# Patient Record
Sex: Female | Born: 1970
Health system: Southern US, Community
[De-identification: ages and names within clinical notes are randomized; demographics above are authoritative.]

---

## 2010-06-26 ENCOUNTER — Emergency Department: Payer: Self-pay | Admitting: Emergency Medicine

## 2011-03-15 ENCOUNTER — Ambulatory Visit: Payer: Self-pay | Admitting: Family Medicine

## 2016-03-18 ENCOUNTER — Encounter: Payer: Self-pay | Admitting: Family Medicine

## 2016-03-18 ENCOUNTER — Ambulatory Visit: Payer: Self-pay

## 2016-03-18 ENCOUNTER — Ambulatory Visit (INDEPENDENT_AMBULATORY_CARE_PROVIDER_SITE_OTHER): Payer: BLUE CROSS/BLUE SHIELD | Admitting: Family Medicine

## 2016-03-18 VITALS — BP 112/78 | HR 76 | Ht 65.0 in | Wt 165.0 lb

## 2016-03-18 DIAGNOSIS — M23311 Other meniscus derangements, anterior horn of medial meniscus, right knee: Secondary | ICD-10-CM | POA: Diagnosis not present

## 2016-03-18 DIAGNOSIS — M25561 Pain in right knee: Secondary | ICD-10-CM

## 2016-03-18 MED ORDER — DICLOFENAC SODIUM 2 % TD SOLN: 2.0000 "application " | Freq: Two times a day (BID) | TRANSDERMAL | 3 refills | Status: DC

## 2016-03-18 NOTE — Progress Notes (Signed)
Tawana ScaleZach Kamarion Zagami D.O. [facility] [address] [place], [place] [address] Phone: [phone]) [phone] Subjective:    I'm seeing this patient by the request  of:  No primary care provider on file.   CC: Right knee pain  YQI:[id number]:Subjective  Jean MingleJoanna Flynn is a 45 y.o. female coming in with complaint of right knee pain. Patient states 5 months ago was hiking and felt some pain when she was going down hill. Patient states that all the pain has been on the medial aspect of the knee. Has been more intermittent. States over the course of the last several weeks has started having increasing instability to a certain degree. More pain even when sitting. States twisting motions causes severe pain. States that the knee has felt like it has buckled one time since this injury. Has never fallen. Has noticed some mild swelling that seems to be intermittent. Not waking her up at night. It is affecting some daily activities. Rates the severity of 5 out of 10.     No past medical history on file. No past surgical history on file. Social History   Social History  . Marital status: Single    Spouse name: N/A  . Number of children: N/A  . Years of education: N/A   Social History Main Topics  . Smoking status: Not on file  . Smokeless tobacco: Not on file  . Alcohol use Not on file  . Drug use: Unknown  . Sexual activity: Not on file   Other Topics Concern  . Not on file   Social History Narrative  . No narrative on file   Not on File No family history on file. No family history rheumatological diseases  Past medical history, social, surgical and family history all reviewed in electronic medical record.  No pertanent information unless stated regarding to the chief complaint.   Review of Systems:Review of systems updated and as accurate as of 03/18/16  No headache, visual changes, nausea, vomiting, diarrhea, constipation, dizziness, abdominal pain, skin rash, fevers, chills, night sweats, weight loss,  swollen lymph nodes, body aches, joint swelling, muscle aches, chest pain, shortness of breath, mood changes.   Objective  Blood pressure 112/78, pulse 76, height 5\' 5"  (1.651 m), weight 165 lb (74.8 kg), SpO2 98 %. Systems examined below as of 03/18/16   General: No apparent distress alert and oriented x3 mood and affect normal, dressed appropriately.  HEENT: Pupils equal, extraocular movements intact  Respiratory: Patient's speak in full sentences and does not appear short of breath  Cardiovascular: No lower extremity edema, non tender, no erythema  Skin: Warm dry intact with no signs of infection or rash on extremities or on axial skeleton.  Abdomen: Soft nontender  Neuro: Cranial nerves II through XII are intact, neurovascularly intact in all extremities with 2+ DTRs and 2+ pulses.  Lymph: No lymphadenopathy of posterior or anterior cervical chain or axillae bilaterally.  Gait normal with good balance and coordination.  MSK:  Non tender with full range of motion and good stability and symmetric strength and tone of shoulders, elbows, wrist, hip, and ankles bilaterally.  Knee: Right Normal to inspection with no erythema or effusion or obvious bony abnormalities. There comes vein on the right leg noted Palpation normal with no warmth, joint line tenderness, patellar tenderness, or condyle tenderness. ROM full in flexion and extension and lower leg rotation. Ligaments with solid consistent endpoints including ACL, PCL, LCL, MCL. Positive Mcmurray's, Apley's, and Thessalonian tests. Non painful patellar compression.  Patellar glide without crepitus. Patellar and quadriceps tendons unremarkable. Hamstring and quadriceps strength is normal.    MSK US performed of: Right This study was ordered, performed, and interpreted by Terrilee FilesZach Aliceson Dolbow D.O.  Knee: Moderate narrowing of the medial joint line noted. Patient does have a fairly large meniscal tear. Appears to be acute on chronic degenerative  changes. Mild displacement of 10-15 percent. Seems to involve approximately 60-65% of the meniscus.  IMPRESSION:  Medial meniscal tear with underlying arthritis    Impression and Recommendations:     This case required medical decision making of moderate complexity.      Note: This dictation was prepared with Dragon dictation along with smaller phrase technology. Any transcriptional errors that result from this process are unintentional.

## 2016-03-18 NOTE — Assessment & Plan Note (Signed)
Patient's tear does start on the anterior aspect of the knee and goes posteriorly. Does have some mild displacement. Given topical anti-inflammatories, work with Event organiserathletic trainer, we discussed icing regimen, discussed which activities to avoid. Patient has increasing instability we may need to consider possible advance imaging. Patient is concerned that she is very active and wants to continue to be active. Follow-up again in 2-3 weeks.

## 2016-03-18 NOTE — Patient Instructions (Signed)
Good to see you  Ice 20 minutes 2 times daily. Usually after activity and before bed. Exercises 3 times a week.  pennsaid pinkie amount topically 2 times daily as needed.  Avoid twisting motions Biking and elliptical would be good.  See me again in 2-3 weeks and if not better we may need an MRI.

## 2016-04-07 NOTE — Progress Notes (Signed)
Tawana ScaleZach Smith D.O. [facility] [address] [place], [place] [address] Phone: [phone] Subjective:     CC: Right knee pain f/u  GNF:[id number]:Subjective  Jean MingleJoanna Zalewski is a 45 y.o. female coming in with complaint of right knee pain.Found to have a large anterior medial meniscal tear of the meniscus. Patient was to try conservative therapy including topical anti-inflammatory's, home exercises and icing. Patient states No significant improvement. Continues to have pain. Still has some mild instability. Having difficulty overall. Still having pain even difficult he is a daily activity.   outside x-rays taken 03/17/2016 show no significant bony abnormality.  No past medical history on file. No past surgical history on file. Social History   Social History  . Marital status: Single    Spouse name: N/A  . Number of children: N/A  . Years of education: N/A   Social History Main Topics  . Smoking status: Not on file  . Smokeless tobacco: Not on file  . Alcohol use Not on file  . Drug use: Unknown  . Sexual activity: Not on file   Other Topics Concern  . Not on file   Social History Narrative  . No narrative on file   Not on File no known drug allergies No family history on file. No family history rheumatological diseases  Past medical history, social, surgical and family history all reviewed in electronic medical record.  No pertanent information unless stated regarding to the chief complaint.   Review of Systems:Review of systems updated and as accurate as of 04/08/16  No headache, visual changes, nausea, vomiting, diarrhea, constipation, dizziness, abdominal pain, skin rash, fevers, chills, night sweats, weight loss, swollen lymph nodes, body aches, joint swelling, muscle aches, chest pain, shortness of breath, mood changes.   Objective  Blood pressure 126/80, pulse 62, height 5\' 5"  (1.651 m), weight 167 lb (75.8 kg), SpO2 98 %.  Systems examined below as of  04/08/16 General: NAD A&O x3 mood, affect normal  HEENT: Pupils equal, extraocular movements intact no nystagmus Respiratory: not short of breath at rest or with speaking Cardiovascular: No lower extremity edema, non tender Skin: Warm dry intact with no signs of infection or rash on extremities or on axial skeleton. Abdomen: Soft nontender, no masses Neuro: Cranial nerves  intact, neurovascularly intact in all extremities with 2+ DTRs and 2+ pulses. Lymph: No lymphadenopathy appreciated today  Gait normal with good balance and coordination.  MSK: Non tender with full range of motion and good stability and symmetric strength and tone of shoulders, elbows, wrist,  hips and ankles bilaterally.   Knee: Right Normal to inspection with no erythema or effusion or obvious bony abnormalities. There comes vein on the right leg noted no change Tenderness to palpation over the medial joint line possibly worse than previous exam ROM full in flexion and extension and lower leg rotation. Ligaments with solid consistent endpoints including ACL, PCL, LCL, MCL. Positive Mcmurray's, Apley's, and Thessalonian tests. Mild painful patellar compression. Patellar glide without crepitus. Patellar and quadriceps tendons unremarkable. Hamstring and quadriceps strength is normal.  Contralateral knee unremarkable  MSK US performed of: Right This study was ordered, performed, and interpreted by Terrilee FilesZach Smith D.O.  Knee: Moderate narrowing of the medial joint line noted. Patient does have a fairly large meniscal tear. Appears to be acute on chronic degenerative changes. Mild displacement of 10 percent still remaining. Significant decrease in hypoechoic changes. Seems to have some healing interval since last exam  IMPRESSION:  Medial meniscal  tear with underlying arthritis noted with some interval healing  After informed written and verbal consent, patient was seated on exam table. Right knee was prepped with alcohol  swab and utilizing anterolateral approach, patient's right knee space was injected with 4:1  marcaine 0.5%: Kenalog 40mg /dL. Patient tolerated the procedure well without immediate complications.    Impression and Recommendations:     This case required medical decision making of moderate complexity.      Note: This dictation was prepared with Dragon dictation along with smaller phrase technology. Any transcriptional errors that result from this process are unintentional.

## 2016-04-08 ENCOUNTER — Ambulatory Visit (INDEPENDENT_AMBULATORY_CARE_PROVIDER_SITE_OTHER): Payer: BLUE CROSS/BLUE SHIELD | Admitting: Family Medicine

## 2016-04-08 ENCOUNTER — Ambulatory Visit: Payer: Self-pay

## 2016-04-08 VITALS — BP 126/80 | HR 62 | Ht 65.0 in | Wt 167.0 lb

## 2016-04-08 DIAGNOSIS — M23311 Other meniscus derangements, anterior horn of medial meniscus, right knee: Secondary | ICD-10-CM

## 2016-04-08 DIAGNOSIS — M25561 Pain in right knee: Secondary | ICD-10-CM

## 2016-04-08 DIAGNOSIS — M1711 Unilateral primary osteoarthritis, right knee: Secondary | ICD-10-CM | POA: Diagnosis not present

## 2016-04-08 NOTE — Patient Instructions (Addendum)
Good to see you  Jean Flynn is your friend.  Injected the knee today  Lets get you moving. Do the exercises 3 times a week at least  No twisting  Send message in 2 weeks and if not better we will get MRI.

## 2016-04-08 NOTE — Assessment & Plan Note (Signed)
No improvement at this time. Patient still does not want have any surgical intervention so on advance imaging. It is starting formal physical therapy and is given an injection today. Tolerated the procedure well. We discussed icing regimen. If any locking or giving out on her advance imaging would be necessary. Patient will follow-up again in 4 weeks.

## 2016-06-15 ENCOUNTER — Other Ambulatory Visit: Payer: BLUE CROSS/BLUE SHIELD

## 2016-06-15 ENCOUNTER — Ambulatory Visit (INDEPENDENT_AMBULATORY_CARE_PROVIDER_SITE_OTHER)
Admission: RE | Admit: 2016-06-15 | Discharge: 2016-06-15 | Disposition: A | Discharge status: Home / Self Care | Payer: BLUE CROSS/BLUE SHIELD | Source: Ambulatory Visit | Attending: Family Medicine | Admitting: Family Medicine

## 2016-06-15 ENCOUNTER — Ambulatory Visit: Payer: Self-pay

## 2016-06-15 ENCOUNTER — Ambulatory Visit (INDEPENDENT_AMBULATORY_CARE_PROVIDER_SITE_OTHER): Payer: BLUE CROSS/BLUE SHIELD | Admitting: Family Medicine

## 2016-06-15 ENCOUNTER — Encounter: Payer: Self-pay | Admitting: Family Medicine

## 2016-06-15 VITALS — BP 132/82 | HR 70 | Ht 65.0 in | Wt 170.0 lb

## 2016-06-15 DIAGNOSIS — M25561 Pain in right knee: Secondary | ICD-10-CM

## 2016-06-15 DIAGNOSIS — M23311 Other meniscus derangements, anterior horn of medial meniscus, right knee: Secondary | ICD-10-CM

## 2016-06-15 DIAGNOSIS — M25461 Effusion, right knee: Secondary | ICD-10-CM | POA: Insufficient documentation

## 2016-06-15 DIAGNOSIS — M1711 Unilateral primary osteoarthritis, right knee: Secondary | ICD-10-CM | POA: Diagnosis not present

## 2016-06-15 NOTE — Progress Notes (Signed)
[name] [address] [place], [place] [address] Phone: [phone] Subjective:     CC: Right knee pain f/u  BJY:[id number]  Jean Flynn is a 46 y.o. female coming in with complaint of right knee pain.Found to have a large anterior medial meniscal tear of the meniscus. Patient back in November was given an injection. nearly 2 months since this time. Patient was to continue conservative therapy. Patient states Having given different pain at this time. States that there is increase in swelling. Pain seems to be in the posterior aspect and the lateral aspect the knee. Completely different than the meniscus injury. Patient states that there is some mild instability. Patient states that this isn't going on for 5-7 days.   outside x-rays taken 03/17/2016 show no significant bony abnormality.  No past medical history on file. No past surgical history on file. Social History   Social History  . Marital status: Single    Spouse name: N/A  . Number of children: N/A  . Years of education: N/A   Social History Main Topics  . Smoking status: Never Smoker  . Smokeless tobacco: Never Used  . Alcohol use None  . Drug use: Unknown  . Sexual activity: Not Asked   Other Topics Concern  . None   Social History Narrative  . None   Not on File no known drug allergies No family history on file. No family history rheumatological diseases  Past medical history, social, surgical and family history all reviewed in electronic medical record.  No pertanent information unless stated regarding to the chief complaint.   Review of Systems: No headache, visual changes, nausea, vomiting, diarrhea, constipation, dizziness, abdominal pain, skin rash, fevers, chills, night sweats, weight loss, swollen lymph nodes,chest pain, shortness of breath, mood changes.  Positive for joint swelling of the right knee and muscle aches  Objective  Blood pressure 132/82, pulse 70, height  5\' 5"  (1.651 m), weight 170 lb (77.1 kg), last menstrual period 06/08/2016, SpO2 98 %.  Systems examined below as of 06/15/16 General: NAD A&O x3 mood, affect normal  HEENT: Pupils equal, extraocular movements intact no nystagmus Respiratory: not short of breath at rest or with speaking Cardiovascular: No lower extremity edema, non tender Skin: Warm dry intact with no signs of infection or rash on extremities or on axial skeleton. Abdomen: Soft nontender, no masses Neuro: Cranial nerves  intact, neurovascularly intact in all extremities with 2+ DTRs and 2+ pulses. Lymph: No lymphadenopathy appreciated today  Gait antalgic gait MSK: Non tender with full range of motion and good stability and symmetric strength and tone of shoulders, elbows, wrist, hips and ankles bilaterally.    Knee: Right Large effusion noted Diffusely tender Lacks last 10 of flexion secondary to the effusion Mild instability noted Positive Mcmurray's, Apley's, and Thessalonian tests. Non painful patellar compression. Patellar glide without crepitus. Patellar and quadriceps tendons unremarkable. Hamstring and quadriceps strength is normal.    MSK US performed of: Right knee This study was ordered, performed, and interpreted by Terrilee Files D.O.  Knee: Large joint effusion noted. Narrowing of the medial and the lateral patellofemoral joint. Significant synovitis noted otherwise patient does have mild to moderate narrowing of the medial and lateral joint lines. Degenerative tear noted of the medial meniscus still relevant  IMPRESSION:  Large joint effusion  Procedure: Real-time Ultrasound Guided Injection of right knee Device: GE Logiq Q7 Ultrasound guided injection is preferred based studies that show increased duration, increased effect,  greater accuracy, decreased procedural pain, increased response rate, and decreased cost with ultrasound guided versus blind injection.  Verbal informed consent obtained.    Time-out conducted.  Noted no overlying erythema, induration, or other signs of local infection.  Skin prepped in a sterile fashion.  Local anesthesia: Topical Ethyl chloride.  With sterile technique and under real time ultrasound guidance: With a 22-gauge 2 inch needle patient was injected with 4 cc of 0.5% Marcaine and aspirated and 37 mL of strawlike fluid then injected 1 cc of Kenalog 40 mg/dL. This was from a superior lateral approach.  Completed without difficulty  Pain immediately resolved suggesting accurate placement of the medication.  Advised to call if fevers/chills, erythema, induration, drainage, or persistent bleeding.  Images permanently stored and available for review in the ultrasound unit.  Impression: Technically successful ultrasound guided injection.    Impression and Recommendations:     This case required medical decision making of moderate complexity.      Note: This dictation was prepared with Dragon dictation along with smaller phrase technology. Any transcriptional errors that result from this process are unintentional.

## 2016-06-15 NOTE — Assessment & Plan Note (Signed)
Patient likely has some degenerative changes of the knee. Repeat x-rays taken today to further evaluate. We did do a significant aspiration of the knee and we'll send down to rule out anything such as gout stay could be contribute in. We discussed icing regimen and compression. Patient has had difficulty with the anterior horn of the medial meniscus previously. Any worsening symptoms or increasing instability advance imaging would be warranted with her failing all other conservative therapy.

## 2016-06-15 NOTE — Patient Instructions (Addendum)
Good to see you  Jean Flynn is your friend.  We drained the knee today  Xray downstairs today  If not better on Monday call and we will get MRI.

## 2016-06-16 LAB — SYNOVIAL CELL COUNT + DIFF, W/ CRYSTALS

## 2016-06-18 ENCOUNTER — Telehealth: Payer: Self-pay | Admitting: Family Medicine

## 2016-06-18 NOTE — Telephone Encounter (Signed)
The patient is wanting the results from her MRI and her labs

## 2016-06-21 NOTE — Telephone Encounter (Signed)
Called pt back, says # is no longer in service.

## 2016-06-21 NOTE — Telephone Encounter (Signed)
Labs normal  Xrays show moderate arthritis.

## 2016-07-17 DIAGNOSIS — S199XXA Unspecified injury of neck, initial encounter: Secondary | ICD-10-CM | POA: Diagnosis not present

## 2016-07-17 DIAGNOSIS — S8011XA Contusion of right lower leg, initial encounter: Secondary | ICD-10-CM | POA: Diagnosis not present

## 2016-07-17 DIAGNOSIS — S99911A Unspecified injury of right ankle, initial encounter: Secondary | ICD-10-CM | POA: Diagnosis not present

## 2016-07-17 DIAGNOSIS — S8991XA Unspecified injury of right lower leg, initial encounter: Secondary | ICD-10-CM | POA: Diagnosis not present

## 2016-07-17 DIAGNOSIS — M542 Cervicalgia: Secondary | ICD-10-CM | POA: Diagnosis not present

## 2016-07-17 DIAGNOSIS — Y92410 Unspecified street and highway as the place of occurrence of the external cause: Secondary | ICD-10-CM | POA: Diagnosis not present

## 2016-07-17 DIAGNOSIS — S79921A Unspecified injury of right thigh, initial encounter: Secondary | ICD-10-CM | POA: Diagnosis not present

## 2016-07-17 DIAGNOSIS — S8012XA Contusion of left lower leg, initial encounter: Secondary | ICD-10-CM | POA: Diagnosis not present

## 2016-07-17 DIAGNOSIS — Z041 Encounter for examination and observation following transport accident: Secondary | ICD-10-CM | POA: Diagnosis not present

## 2016-07-17 DIAGNOSIS — S8992XA Unspecified injury of left lower leg, initial encounter: Secondary | ICD-10-CM | POA: Diagnosis not present

## 2016-07-17 DIAGNOSIS — S99912A Unspecified injury of left ankle, initial encounter: Secondary | ICD-10-CM | POA: Diagnosis not present

## 2016-07-22 DIAGNOSIS — M79606 Pain in leg, unspecified: Secondary | ICD-10-CM | POA: Diagnosis not present

## 2016-07-22 DIAGNOSIS — M542 Cervicalgia: Secondary | ICD-10-CM | POA: Diagnosis not present

## 2016-09-17 ENCOUNTER — Ambulatory Visit (INDEPENDENT_AMBULATORY_CARE_PROVIDER_SITE_OTHER)
Admission: RE | Admit: 2016-09-17 | Discharge: 2016-09-17 | Disposition: A | Discharge status: Home / Self Care | Payer: BLUE CROSS/BLUE SHIELD | Source: Ambulatory Visit | Attending: Family Medicine | Admitting: Family Medicine

## 2016-09-17 ENCOUNTER — Ambulatory Visit (INDEPENDENT_AMBULATORY_CARE_PROVIDER_SITE_OTHER): Payer: BLUE CROSS/BLUE SHIELD | Admitting: Family Medicine

## 2016-09-17 ENCOUNTER — Encounter: Payer: Self-pay | Admitting: Family Medicine

## 2016-09-17 VITALS — BP 110/80 | HR 68 | Ht 65.0 in | Wt 170.0 lb

## 2016-09-17 DIAGNOSIS — M25561 Pain in right knee: Secondary | ICD-10-CM

## 2016-09-17 DIAGNOSIS — S82409A Unspecified fracture of shaft of unspecified fibula, initial encounter for closed fracture: Secondary | ICD-10-CM | POA: Insufficient documentation

## 2016-09-17 DIAGNOSIS — S82831A Other fracture of upper and lower end of right fibula, initial encounter for closed fracture: Secondary | ICD-10-CM | POA: Diagnosis not present

## 2016-09-17 DIAGNOSIS — S8991XA Unspecified injury of right lower leg, initial encounter: Secondary | ICD-10-CM | POA: Diagnosis not present

## 2016-09-17 MED ORDER — VITAMIN D (ERGOCALCIFEROL) 1.25 MG (50000 UNIT) PO CAPS: 50000.0000 [IU] | ORAL_CAPSULE | ORAL | 0 refills | Status: AC

## 2016-09-17 MED ORDER — PREDNISONE 50 MG PO TABS: 50.0000 mg | ORAL_TABLET | Freq: Every day | ORAL | 0 refills | Status: DC

## 2016-09-17 MED ORDER — GABAPENTIN 100 MG PO CAPS: 200.0000 mg | ORAL_CAPSULE | Freq: Every day | ORAL | 3 refills | Status: DC

## 2016-09-17 NOTE — Patient Instructions (Signed)
Good to see you  Xray downstairs You had a proximal fibular head with nerve irritation.  Prednisone daily for 5 days Gabapentin 200mg at night  Once weekly vitamin D for 12 weeks.  Ok to bike  See me again in 2 weeks.

## 2016-09-17 NOTE — Assessment & Plan Note (Signed)
I believe the patient did have more of a fibula fracture. Seems to be healing. Does cause irritation of the nerve. Patient will be in a brace, once weekly vitamin D, gabapentin for the nerve irritation. Discussed 14 certain activities. Patient still has symptoms from the meniscal tear and we'll continue to monitor. Follow-up again in 2-3 weeks.
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
## 2016-10-06 ENCOUNTER — Encounter: Payer: Self-pay | Admitting: Family Medicine

## 2016-10-06 ENCOUNTER — Ambulatory Visit: Payer: Self-pay

## 2016-10-06 ENCOUNTER — Ambulatory Visit (INDEPENDENT_AMBULATORY_CARE_PROVIDER_SITE_OTHER): Payer: BLUE CROSS/BLUE SHIELD | Admitting: Family Medicine

## 2016-10-06 ENCOUNTER — Telehealth: Payer: Self-pay

## 2016-10-06 VITALS — BP 124/84 | HR 61 | Ht 65.0 in | Wt 156.0 lb

## 2016-10-06 DIAGNOSIS — M79604 Pain in right leg: Secondary | ICD-10-CM

## 2016-10-06 DIAGNOSIS — M7121 Synovial cyst of popliteal space [Baker], right knee: Secondary | ICD-10-CM | POA: Diagnosis not present

## 2016-10-06 DIAGNOSIS — S82831D Other fracture of upper and lower end of right fibula, subsequent encounter for closed fracture with routine healing: Secondary | ICD-10-CM | POA: Diagnosis not present

## 2016-10-06 NOTE — Telephone Encounter (Signed)
Patient was walking out of appointment and inquired if she should continue with gabapentin. Dr. Katrinka BlazingSmith provided verbal confirmation that she could continue gabapentin if it was helping her sleep and that she did not need to taper once she did decide to discontinue medication. Called patient to relay information. Was unable to leave a message as voicemail box was not set up.

## 2016-10-06 NOTE — Assessment & Plan Note (Signed)
Seems well healed at this time. No change in management. Likely another 2-4 weeks to completely healed

## 2016-10-06 NOTE — Patient Instructions (Addendum)
Great to see you  Ice is still good  Continue the once weekly vitamin D Continue the gabapentin Start increasing your activity  See me again in 4 weeks. Should be good.

## 2016-10-06 NOTE — Assessment & Plan Note (Signed)
Patient did have aspiration done today. Tolerated the procedure well. We discussed icing regimen and home exercises. We discussed which x-rays to do in which ones to avoid. Patient well continue the once weekly vitamin D as well as the gabapentin at night. Follow-up again in 3-4 weeks.

## 2016-10-07 NOTE — Telephone Encounter (Signed)
Attempted to contact patient again. No answer and voicemail box is not set up.

## 2016-11-24 ENCOUNTER — Encounter: Payer: Self-pay | Admitting: Family Medicine

## 2016-11-24 ENCOUNTER — Ambulatory Visit (INDEPENDENT_AMBULATORY_CARE_PROVIDER_SITE_OTHER): Payer: BLUE CROSS/BLUE SHIELD | Admitting: Family Medicine

## 2016-11-24 VITALS — BP 120/84 | HR 86 | Resp 16 | Wt 159.0 lb

## 2016-11-24 DIAGNOSIS — S82831D Other fracture of upper and lower end of right fibula, subsequent encounter for closed fracture with routine healing: Secondary | ICD-10-CM | POA: Diagnosis not present

## 2016-11-24 DIAGNOSIS — S82141A Displaced bicondylar fracture of right tibia, initial encounter for closed fracture: Secondary | ICD-10-CM | POA: Diagnosis not present

## 2016-11-24 DIAGNOSIS — M7121 Synovial cyst of popliteal space [Baker], right knee: Secondary | ICD-10-CM | POA: Diagnosis not present

## 2016-11-24 DIAGNOSIS — M1711 Unilateral primary osteoarthritis, right knee: Secondary | ICD-10-CM

## 2016-11-24 DIAGNOSIS — M25461 Effusion, right knee: Secondary | ICD-10-CM | POA: Diagnosis not present

## 2016-11-24 DIAGNOSIS — M23311 Other meniscus derangements, anterior horn of medial meniscus, right knee: Secondary | ICD-10-CM

## 2016-11-24 NOTE — Patient Instructions (Signed)
Good to see you  We will get MRI of the right knee soon.  They will call you to schedule If swelling worsens then take duexis up to 3 times a day but watch your stomach  See me again in 4 weeks if we need to drain the knee before your trip

## 2016-11-24 NOTE — Assessment & Plan Note (Signed)
Patient is having worsening symptoms again. I do believe that this could be secondary to the injury. Occult fracture is within the differential with the tibial plateau. I do feel that advance imaging is warranted. Patient is now having antalgic gait and is affecting daily activities as well as her job performance. Patient will be set up for an MRI and depending on findings we'll discuss further medical management. Differential includes the meniscal injury, as well as the occult fracture of the fibula that was noted previously. Patient's family history is significant for bone cancer as well.
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
## 2016-11-25 ENCOUNTER — Telehealth: Payer: Self-pay

## 2016-11-25 NOTE — Telephone Encounter (Signed)
Patient called my direct number and left a voicemail wanting to know if the MRI had been approved and what ortho Dr. Katrinka BlazingSmith will be referring her to. Tried calling patient's cell phone. No answer and no ability to leave a voicemail as mail box was not set up. Want to let patient know that Dr. Katrinka BlazingSmith will use Dr. Yisroel Rammingaldorf or Luiz BlareGraves [facility] and that we are working to pre-cert the MRI and then she should hear from [facility] by this week or early next week.

## 2016-12-03 DIAGNOSIS — Z1231 Encounter for screening mammogram for malignant neoplasm of breast: Secondary | ICD-10-CM | POA: Diagnosis not present

## 2016-12-08 ENCOUNTER — Encounter: Payer: Self-pay | Admitting: Family Medicine

## 2016-12-15 DIAGNOSIS — M25461 Effusion, right knee: Secondary | ICD-10-CM | POA: Diagnosis not present
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
## 2016-12-31 ENCOUNTER — Ambulatory Visit (INDEPENDENT_AMBULATORY_CARE_PROVIDER_SITE_OTHER): Payer: BLUE CROSS/BLUE SHIELD | Admitting: Family Medicine

## 2016-12-31 ENCOUNTER — Encounter: Payer: Self-pay | Admitting: Family Medicine

## 2016-12-31 ENCOUNTER — Ambulatory Visit: Payer: Self-pay

## 2016-12-31 VITALS — BP 122/82 | HR 59 | Ht 65.0 in

## 2016-12-31 DIAGNOSIS — M7121 Synovial cyst of popliteal space [Baker], right knee: Secondary | ICD-10-CM | POA: Diagnosis not present

## 2016-12-31 MED ORDER — MELOXICAM 15 MG PO TABS
15.0000 mg | ORAL_TABLET | Freq: Every day | ORAL | 0 refills | Status: DC
Start: 2016-12-31 — End: 2018-02-07

## 2016-12-31 MED ORDER — GABAPENTIN 100 MG PO CAPS: 200.0000 mg | ORAL_CAPSULE | Freq: Every day | ORAL | 3 refills | Status: DC

## 2016-12-31 MED ORDER — TRAMADOL HCL 50 MG PO TABS: 50.0000 mg | ORAL_TABLET | Freq: Three times a day (TID) | ORAL | 0 refills | Status: DC | PRN

## 2016-12-31 NOTE — Patient Instructions (Addendum)
Have a great tri p  Drained the knee  Wear compression  Ice is your friend Tramadol up to 2 times a day if you really need it.  meloxicam daily for 10 days then as needed 2 babay aspirin daily bjut watch for bruising.  Gabapentin at night if needed See me again after your MRI.
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
## 2017-02-07 ENCOUNTER — Ambulatory Visit
Admission: RE | Admit: 2017-02-07 | Discharge: 2017-02-07 | Disposition: A | Discharge status: Home / Self Care | Payer: BLUE CROSS/BLUE SHIELD | Source: Ambulatory Visit | Attending: Family Medicine | Admitting: Family Medicine

## 2017-02-07 DIAGNOSIS — S83231A Complex tear of medial meniscus, current injury, right knee, initial encounter: Secondary | ICD-10-CM | POA: Diagnosis not present

## 2017-02-07 DIAGNOSIS — S82141A Displaced bicondylar fracture of right tibia, initial encounter for closed fracture: Secondary | ICD-10-CM
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
## 2017-02-08 ENCOUNTER — Ambulatory Visit (INDEPENDENT_AMBULATORY_CARE_PROVIDER_SITE_OTHER): Payer: BLUE CROSS/BLUE SHIELD | Admitting: Family Medicine

## 2017-02-08 ENCOUNTER — Encounter: Payer: Self-pay | Admitting: Family Medicine

## 2017-02-08 VITALS — BP 130/80 | HR 50 | Ht 65.0 in | Wt 160.0 lb

## 2017-02-08 DIAGNOSIS — M23311 Other meniscus derangements, anterior horn of medial meniscus, right knee: Secondary | ICD-10-CM | POA: Diagnosis not present

## 2017-02-08 DIAGNOSIS — M1711 Unilateral primary osteoarthritis, right knee: Secondary | ICD-10-CM | POA: Diagnosis not present

## 2017-02-08 NOTE — Patient Instructions (Signed)
good to see you  Graves for the meniscus.  We will get it set up  Do not lace last eye on the boots.  We have other injection in the long run to give you more time with the knee for sure.  See me when you need me and call if you have questions.

## 2017-02-08 NOTE — Assessment & Plan Note (Signed)
Spent  25 minutes with patient face-to-face and had greater than 50% of counseling including as described in assessment and plan. We discussed with patient about conservative therapy including viscous supplementation. Patient does have a complex tear noted that was seen initially in the ultrasound and confirmed with the MRI. Patient has not done well with conservative therapy at this point. Would like to consider surgical intervention. We discussed the possibility of this patient will be referred today. Patient does have moderate to severe arthritic changes of the medial compartment of the knee as well. Patient will need to discuss this with her surgeon to see what would be the best possible treatment options and if patient needs a unilateral compartment replacement. Patient will follow-up with me again as needed.

## 2017-02-15 DIAGNOSIS — M25561 Pain in right knee: Secondary | ICD-10-CM | POA: Diagnosis not present

## 2017-03-01 DIAGNOSIS — M25561 Pain in right knee: Secondary | ICD-10-CM | POA: Diagnosis not present

## 2017-03-01 DIAGNOSIS — M1711 Unilateral primary osteoarthritis, right knee: Secondary | ICD-10-CM | POA: Diagnosis not present

## 2017-03-08 DIAGNOSIS — M1711 Unilateral primary osteoarthritis, right knee: Secondary | ICD-10-CM | POA: Diagnosis not present

## 2017-03-15 DIAGNOSIS — M1711 Unilateral primary osteoarthritis, right knee: Secondary | ICD-10-CM | POA: Diagnosis not present

## 2017-04-12 DIAGNOSIS — M25561 Pain in right knee: Secondary | ICD-10-CM | POA: Diagnosis not present

## 2017-05-24 DIAGNOSIS — M1711 Unilateral primary osteoarthritis, right knee: Secondary | ICD-10-CM | POA: Diagnosis not present

## 2017-05-26 DIAGNOSIS — E039 Hypothyroidism, unspecified: Secondary | ICD-10-CM | POA: Diagnosis not present

## 2017-05-26 DIAGNOSIS — M25561 Pain in right knee: Secondary | ICD-10-CM | POA: Diagnosis not present

## 2017-05-26 DIAGNOSIS — Z0001 Encounter for general adult medical examination with abnormal findings: Secondary | ICD-10-CM | POA: Diagnosis not present

## 2017-05-26 DIAGNOSIS — Z124 Encounter for screening for malignant neoplasm of cervix: Secondary | ICD-10-CM | POA: Diagnosis not present

## 2017-06-24 DIAGNOSIS — M23221 Derangement of posterior horn of medial meniscus due to old tear or injury, right knee: Secondary | ICD-10-CM | POA: Diagnosis not present

## 2017-06-24 DIAGNOSIS — M6751 Plica syndrome, right knee: Secondary | ICD-10-CM | POA: Diagnosis not present

## 2017-06-24 DIAGNOSIS — M7121 Synovial cyst of popliteal space [Baker], right knee: Secondary | ICD-10-CM | POA: Diagnosis not present

## 2017-06-24 DIAGNOSIS — M94261 Chondromalacia, right knee: Secondary | ICD-10-CM | POA: Diagnosis not present

## 2017-06-24 DIAGNOSIS — M2241 Chondromalacia patellae, right knee: Secondary | ICD-10-CM | POA: Diagnosis not present

## 2017-06-24 DIAGNOSIS — M23241 Derangement of anterior horn of lateral meniscus due to old tear or injury, right knee: Secondary | ICD-10-CM | POA: Diagnosis not present

## 2017-06-24 DIAGNOSIS — G8918 Other acute postprocedural pain: Secondary | ICD-10-CM | POA: Diagnosis not present

## 2017-06-27 DIAGNOSIS — M25461 Effusion, right knee: Secondary | ICD-10-CM | POA: Diagnosis not present

## 2017-06-30 DIAGNOSIS — M25661 Stiffness of right knee, not elsewhere classified: Secondary | ICD-10-CM | POA: Diagnosis not present

## 2017-06-30 DIAGNOSIS — M25561 Pain in right knee: Secondary | ICD-10-CM | POA: Diagnosis not present

## 2017-06-30 DIAGNOSIS — M23231 Derangement of other medial meniscus due to old tear or injury, right knee: Secondary | ICD-10-CM | POA: Diagnosis not present

## 2017-07-05 DIAGNOSIS — M23231 Derangement of other medial meniscus due to old tear or injury, right knee: Secondary | ICD-10-CM | POA: Diagnosis not present

## 2017-07-05 DIAGNOSIS — M25661 Stiffness of right knee, not elsewhere classified: Secondary | ICD-10-CM | POA: Diagnosis not present

## 2017-07-05 DIAGNOSIS — M25561 Pain in right knee: Secondary | ICD-10-CM | POA: Diagnosis not present

## 2017-07-07 DIAGNOSIS — M23231 Derangement of other medial meniscus due to old tear or injury, right knee: Secondary | ICD-10-CM | POA: Diagnosis not present

## 2017-07-07 DIAGNOSIS — M25661 Stiffness of right knee, not elsewhere classified: Secondary | ICD-10-CM | POA: Diagnosis not present

## 2017-07-07 DIAGNOSIS — M25561 Pain in right knee: Secondary | ICD-10-CM | POA: Diagnosis not present

## 2017-07-11 DIAGNOSIS — M23231 Derangement of other medial meniscus due to old tear or injury, right knee: Secondary | ICD-10-CM | POA: Diagnosis not present

## 2017-07-11 DIAGNOSIS — M25561 Pain in right knee: Secondary | ICD-10-CM | POA: Diagnosis not present

## 2017-07-11 DIAGNOSIS — M25661 Stiffness of right knee, not elsewhere classified: Secondary | ICD-10-CM | POA: Diagnosis not present

## 2017-07-25 DIAGNOSIS — M25561 Pain in right knee: Secondary | ICD-10-CM | POA: Diagnosis not present

## 2017-07-25 DIAGNOSIS — M25461 Effusion, right knee: Secondary | ICD-10-CM | POA: Diagnosis not present

## 2017-07-28 DIAGNOSIS — M23231 Derangement of other medial meniscus due to old tear or injury, right knee: Secondary | ICD-10-CM | POA: Diagnosis not present

## 2017-07-28 DIAGNOSIS — M25561 Pain in right knee: Secondary | ICD-10-CM | POA: Diagnosis not present

## 2017-07-28 DIAGNOSIS — M25661 Stiffness of right knee, not elsewhere classified: Secondary | ICD-10-CM | POA: Diagnosis not present

## 2017-08-09 DIAGNOSIS — M23231 Derangement of other medial meniscus due to old tear or injury, right knee: Secondary | ICD-10-CM | POA: Diagnosis not present

## 2017-08-09 DIAGNOSIS — M1711 Unilateral primary osteoarthritis, right knee: Secondary | ICD-10-CM | POA: Diagnosis not present

## 2017-08-09 DIAGNOSIS — M25661 Stiffness of right knee, not elsewhere classified: Secondary | ICD-10-CM | POA: Diagnosis not present

## 2017-08-09 DIAGNOSIS — M25561 Pain in right knee: Secondary | ICD-10-CM | POA: Diagnosis not present

## 2017-08-15 DIAGNOSIS — M25561 Pain in right knee: Secondary | ICD-10-CM | POA: Diagnosis not present

## 2017-08-15 DIAGNOSIS — M23231 Derangement of other medial meniscus due to old tear or injury, right knee: Secondary | ICD-10-CM | POA: Diagnosis not present

## 2017-08-15 DIAGNOSIS — M25661 Stiffness of right knee, not elsewhere classified: Secondary | ICD-10-CM | POA: Diagnosis not present

## 2017-09-16 DIAGNOSIS — L821 Other seborrheic keratosis: Secondary | ICD-10-CM | POA: Diagnosis not present

## 2017-09-16 DIAGNOSIS — L578 Other skin changes due to chronic exposure to nonionizing radiation: Secondary | ICD-10-CM | POA: Diagnosis not present

## 2017-09-16 DIAGNOSIS — L300 Nummular dermatitis: Secondary | ICD-10-CM | POA: Diagnosis not present

## 2017-09-16 DIAGNOSIS — D225 Melanocytic nevi of trunk: Secondary | ICD-10-CM | POA: Diagnosis not present

## 2017-09-16 DIAGNOSIS — L570 Actinic keratosis: Secondary | ICD-10-CM | POA: Diagnosis not present

## 2017-10-04 DIAGNOSIS — M23311 Other meniscus derangements, anterior horn of medial meniscus, right knee: Secondary | ICD-10-CM | POA: Diagnosis not present

## 2017-12-12 NOTE — Progress Notes (Signed)
Jean ScaleZach Flynn D.O. [facility] [address] [place], [place] [address] Phone: [phone] Subjective:    I'm seeing this patient by the request  of:    CC: Right knee pain  BJY:[id number]:Subjective  Jean MingleJoanna Flynn is a 47 y.o. female coming in with complaint of right knee pain. Had surgery in February. States her knee is swollen. Knee locks up. Has to use the opposite leg to help extend it. Knee has to pop before she can use it. Still wears the brace prescribed. No numbness and tingling. Shooting pain down the leg.   Onset- Chronic Location- Lateral, posterior Duration-  Character-dull, throbbing aching Aggravating factors- Flexion, extension, sitting to standing Reliving factors- Ice Therapies tried-  Severity-8 out of 10.  Patient is considering the possibility of replacement     History reviewed. No pertinent past medical history. History reviewed. No pertinent surgical history. Social History   Socioeconomic History  . Marital status: Single    Spouse name: Not on file  . Number of children: Not on file  . Years of education: Not on file  . Highest education level: Not on file  Occupational History  . Not on file  Social Needs  . Financial resource strain: Not on file  . Food insecurity:    Worry: Not on file    Inability: Not on file  . Transportation needs:    Medical: Not on file    Non-medical: Not on file  Tobacco Use  . Smoking status: Never Smoker  . Smokeless tobacco: Never Used  Substance and Sexual Activity  . Alcohol use: Not on file  . Drug use: Not on file  . Sexual activity: Not on file  Lifestyle  . Physical activity:    Days per week: Not on file    Minutes per session: Not on file  . Stress: Not on file  Relationships  . Social connections:    Talks on phone: Not on file    Gets together: Not on file    Attends religious service: Not on file    Active member of club or organization: Not on file    Attends meetings of clubs or  organizations: Not on file    Relationship status: Not on file  Other Topics Concern  . Not on file  Social History Narrative  . Not on file   Not on File History reviewed. No pertinent family history.   Past medical history, social, surgical and family history all reviewed in electronic medical record.  No pertanent information unless stated regarding to the chief complaint.   Review of Systems:Review of systems updated and as accurate as of 12/14/17  No headache, visual changes, nausea, vomiting, diarrhea, constipation, dizziness, abdominal pain, skin rash, fevers, chills, night sweats, weight loss, swollen lymph nodes, body aches, , chest pain, shortness of breath, mood changes.  Positive muscle aches, joint swelling  Objective  Blood pressure 110/70, pulse 60, height 5\' 5"  (1.651 m), weight 159 lb (72.1 kg), SpO2 98 %. Systems examined below as of 12/14/17   General: No apparent distress alert and oriented x3 mood and affect normal, dressed appropriately.  HEENT: Pupils equal, extraocular movements intact  Respiratory: Patient's speak in full sentences and does not appear short of breath  Cardiovascular: No lower extremity edema, non tender, no erythema  Skin: Warm dry intact with no signs of infection or rash on extremities or on axial skeleton.  Abdomen: Soft nontender  Neuro: Cranial nerves II through XII are  intact, neurovascularly intact in all extremities with 2+ DTRs and 2+ pulses.  Lymph: No lymphadenopathy of posterior or anterior cervical chain or axillae bilaterally.  Gait antalgic MSK:  Non tender with full range of motion and good stability and symmetric strength and tone of shoulders, elbows, wrist, hip, and ankles bilaterally.  Knee: Right valgus deformity noted. Large thigh to calf ratio.  Patient Tender to palpation over medial and PF joint line.  ROM full in flexion and extension and lower leg rotation. instability with valgus force.  painful patellar  compression. Patellar glide with moderate crepitus. Patellar and quadriceps tendons unremarkable. Hamstring and quadriceps strength is normal. Contralateral knee shows mild arthritic changes  After informed written and verbal consent, patient was seated on exam table. Right knee was prepped with alcohol swab and utilizing anterolateral approach, patient's right knee space was injected with 4:1  marcaine 0.5%: Kenalog 40mg /dL. Patient tolerated the procedure well without immediate complications.    Impression and Recommendations:     This case required medical decision making of moderate complexity.      Note: This dictation was prepared with Dragon dictation along with smaller phrase technology. Any transcriptional errors that result from this process are unintentional.

## 2017-12-14 ENCOUNTER — Ambulatory Visit (INDEPENDENT_AMBULATORY_CARE_PROVIDER_SITE_OTHER): Payer: BLUE CROSS/BLUE SHIELD | Admitting: Family Medicine

## 2017-12-14 ENCOUNTER — Encounter: Payer: Self-pay | Admitting: Family Medicine

## 2017-12-14 DIAGNOSIS — M1711 Unilateral primary osteoarthritis, right knee: Secondary | ICD-10-CM | POA: Diagnosis not present

## 2017-12-14 MED ORDER — DICLOFENAC SODIUM 75 MG PO TBEC: 75.0000 mg | DELAYED_RELEASE_TABLET | Freq: Two times a day (BID) | ORAL | 1 refills | Status: DC

## 2017-12-14 NOTE — Patient Instructions (Signed)
Good to see you  Jean Flynn is your friend.  Consider compression daily  Brace with a lot of activity  See me again in 6-7 weeks and we [place] and then we will consider replacement.

## 2017-12-14 NOTE — Assessment & Plan Note (Signed)
Injected again today.  Tolerated the procedure well.  Did have aspiration of 35 cc of strawlike color fluid.  Discussed icing regimen, home exercises, which activities to do which wants to avoid.  Follow-up again in 4 to 6 weeks.  At that time possibly another aspiration.  Patient is going to consider joint replacement.

## 2017-12-19 DIAGNOSIS — Z1231 Encounter for screening mammogram for malignant neoplasm of breast: Secondary | ICD-10-CM | POA: Diagnosis not present

## 2017-12-23 DIAGNOSIS — R928 Other abnormal and inconclusive findings on diagnostic imaging of breast: Secondary | ICD-10-CM | POA: Diagnosis not present

## 2017-12-23 DIAGNOSIS — N6312 Unspecified lump in the right breast, upper inner quadrant: Secondary | ICD-10-CM | POA: Diagnosis not present

## 2017-12-23 DIAGNOSIS — R922 Inconclusive mammogram: Secondary | ICD-10-CM | POA: Diagnosis not present

## 2017-12-23 DIAGNOSIS — N6311 Unspecified lump in the right breast, upper outer quadrant: Secondary | ICD-10-CM | POA: Diagnosis not present

## 2018-01-24 ENCOUNTER — Ambulatory Visit: Payer: BLUE CROSS/BLUE SHIELD | Admitting: Family Medicine

## 2018-02-06 NOTE — Progress Notes (Signed)
[facility] [address] [place], [place] [address] Phone: [phone] Subjective:    I Jean Flynn am serving as a Neurosurgeon for Dr. Antoine Primas.   I'm seeing this patient by the request  of:    CC: Right knee pain follow-up  HQI:[id number]  Jean Flynn is a 47 y.o. female coming in with complaint of right knee pain. Draining fluid today. States the knee is not as bad as it has been. Wants a refill for the antiinflammatory.  Known arthritic changes with instability.  Patient states increased swelling noted as well.  Patient is going to have replacement at the end of the year.      No past medical history on file. No past surgical history on file. Social History   Socioeconomic History  . Marital status: Single    Spouse name: Not on file  . Number of children: Not on file  . Years of education: Not on file  . Highest education level: Not on file  Occupational History  . Not on file  Social Needs  . Financial resource strain: Not on file  . Food insecurity:    Worry: Not on file    Inability: Not on file  . Transportation needs:    Medical: Not on file    Non-medical: Not on file  Tobacco Use  . Smoking status: Never Smoker  . Smokeless tobacco: Never Used  Substance and Sexual Activity  . Alcohol use: Not on file  . Drug use: Not on file  . Sexual activity: Not on file  Lifestyle  . Physical activity:    Days per week: Not on file    Minutes per session: Not on file  . Stress: Not on file  Relationships  . Social connections:    Talks on phone: Not on file    Gets together: Not on file    Attends religious service: Not on file    Active member of club or organization: Not on file    Attends meetings of clubs or organizations: Not on file    Relationship status: Not on file  Other Topics Concern  . Not on file  Social History Narrative  . Not on file   Not on File No family history on file.  Current Outpatient  Medications (Endocrine & Metabolic):  .  levothyroxine (SYNTHROID, LEVOTHROID) 125 MCG tablet, TK 1 T PO  D    Current Outpatient Medications (Analgesics):  .  diclofenac (VOLTAREN) 75 MG EC tablet, Take 1 tablet (75 mg total) by mouth 2 (two) times daily.   Current Outpatient Medications (Other):  Marland Kitchen  Vitamin D, Ergocalciferol, (DRISDOL) 50000 units CAPS capsule, Take 1 capsule (50,000 Units total) by mouth every 7 (seven) days.    Past medical history, social, surgical and family history all reviewed in electronic medical record.  No pertanent information unless stated regarding to the chief complaint.   Review of Systems:  No headache, visual changes, nausea, vomiting, diarrhea, constipation, dizziness, abdominal pain, skin rash, fevers, chills, night sweats, weight loss, swollen lymph nodes, body aches, joint swelling,  chest pain, shortness of breath, mood changes.  Positive muscle aches  Objective  Blood pressure 100/60, pulse 63, height 5\' 5"  (1.651 m), weight 159 lb (72.1 kg), SpO2 96 %.   General: No apparent distress alert and oriented x3 mood and affect normal, dressed appropriately.  HEENT: Pupils equal, extraocular movements intact  Respiratory: Patient's speak in full sentences and does not  appear short of breath  Cardiovascular: No lower extremity edema, non tender, no erythema  Skin: Warm dry intact with no signs of infection or rash on extremities or on axial skeleton.  Abdomen: Soft nontender  Neuro: Cranial nerves II through XII are intact, neurovascularly intact in all extremities with 2+ DTRs and 2+ pulses.  Lymph: No lymphadenopathy of posterior or anterior cervical chain or axillae bilaterally.  Gait antalgic MSK:  Non tender with full range of motion and good stability and symmetric strength and tone of shoulders, elbows, wrist, hip and ankles bilaterally.  Knee: Right valgus deformity noted.  Abnormal thigh to calf ratio.  Tender to palpation over medial and  PF joint line.  ROM full in flexion and extension and lower leg rotation. instability with valgus force.  painful patellar compression. Patellar glide with moderate crepitus. Patellar and quadriceps tendons unremarkable. Hamstring and quadriceps strength is normal. Contralateral knee shows no significant changes and no pain  After informed written and verbal consent, patient was seated on exam table. Right knee was prepped with alcohol swab and utilizing anterolateral approach, patient's right knee space was injected with 4:1  marcaine 0.5%: Kenalog 40mg /dL. Patient tolerated the procedure well without immediate complications.    Impression and Recommendations:     This case required medical decision making of moderate complexity. The above documentation has been reviewed and is accurate and complete Judi Saa, DO       Note: This dictation was prepared with Dragon dictation along with smaller phrase technology. Any transcriptional errors that result from this process are unintentional.

## 2018-02-07 ENCOUNTER — Ambulatory Visit (INDEPENDENT_AMBULATORY_CARE_PROVIDER_SITE_OTHER): Payer: BLUE CROSS/BLUE SHIELD | Admitting: Family Medicine

## 2018-02-07 DIAGNOSIS — M1711 Unilateral primary osteoarthritis, right knee: Secondary | ICD-10-CM | POA: Diagnosis not present

## 2018-02-07 MED ORDER — DICLOFENAC SODIUM 75 MG PO TBEC: 75.0000 mg | DELAYED_RELEASE_TABLET | Freq: Two times a day (BID) | ORAL | 1 refills | Status: AC

## 2018-02-07 NOTE — Patient Instructions (Signed)
Good to see you  Have a [place] time in [place]  Refilled meds I am here if you have questions

## 2018-02-07 NOTE — Assessment & Plan Note (Signed)
Repeat injection given today.  Tolerated the procedure well.  Discussed icing regimen and home exercise.  Discussed which activities to do which wants to avoid.  Increase activity as tolerated.  Follow-up again in 6 to 12 weeks if necessary but patient may do some changes.

## 2018-03-07 DIAGNOSIS — M25561 Pain in right knee: Secondary | ICD-10-CM | POA: Diagnosis not present

## 2018-04-10 DIAGNOSIS — Z6826 Body mass index (BMI) 26.0-26.9, adult: Secondary | ICD-10-CM | POA: Diagnosis not present

## 2018-04-10 DIAGNOSIS — M1711 Unilateral primary osteoarthritis, right knee: Secondary | ICD-10-CM | POA: Diagnosis not present

## 2018-04-10 DIAGNOSIS — M1731 Unilateral post-traumatic osteoarthritis, right knee: Secondary | ICD-10-CM | POA: Diagnosis not present

## 2018-04-10 DIAGNOSIS — E039 Hypothyroidism, unspecified: Secondary | ICD-10-CM | POA: Diagnosis not present

## 2018-05-01 DIAGNOSIS — Z96651 Presence of right artificial knee joint: Secondary | ICD-10-CM | POA: Diagnosis not present

## 2018-05-01 DIAGNOSIS — M1711 Unilateral primary osteoarthritis, right knee: Secondary | ICD-10-CM | POA: Diagnosis not present

## 2018-05-15 DIAGNOSIS — M1711 Unilateral primary osteoarthritis, right knee: Secondary | ICD-10-CM | POA: Diagnosis not present

## 2018-05-17 DIAGNOSIS — M1711 Unilateral primary osteoarthritis, right knee: Secondary | ICD-10-CM | POA: Diagnosis not present

## 2018-05-23 DIAGNOSIS — M1711 Unilateral primary osteoarthritis, right knee: Secondary | ICD-10-CM | POA: Diagnosis not present

## 2018-05-25 DIAGNOSIS — M1711 Unilateral primary osteoarthritis, right knee: Secondary | ICD-10-CM | POA: Diagnosis not present

## 2018-05-30 DIAGNOSIS — M1711 Unilateral primary osteoarthritis, right knee: Secondary | ICD-10-CM | POA: Diagnosis not present

## 2018-06-01 DIAGNOSIS — M1711 Unilateral primary osteoarthritis, right knee: Secondary | ICD-10-CM | POA: Diagnosis not present

## 2018-06-06 DIAGNOSIS — M1711 Unilateral primary osteoarthritis, right knee: Secondary | ICD-10-CM | POA: Diagnosis not present

## 2018-06-12 DIAGNOSIS — M25561 Pain in right knee: Secondary | ICD-10-CM | POA: Diagnosis not present

## 2018-06-12 DIAGNOSIS — Z9889 Other specified postprocedural states: Secondary | ICD-10-CM | POA: Diagnosis not present

## 2018-10-26 DIAGNOSIS — M25561 Pain in right knee: Secondary | ICD-10-CM | POA: Diagnosis not present

## 2019-02-02 DIAGNOSIS — Z1231 Encounter for screening mammogram for malignant neoplasm of breast: Secondary | ICD-10-CM | POA: Diagnosis not present

## 2019-02-02 DIAGNOSIS — Z1239 Encounter for other screening for malignant neoplasm of breast: Secondary | ICD-10-CM | POA: Diagnosis not present

## 2019-02-23 DIAGNOSIS — R928 Other abnormal and inconclusive findings on diagnostic imaging of breast: Secondary | ICD-10-CM | POA: Diagnosis not present

## 2019-02-23 DIAGNOSIS — R921 Mammographic calcification found on diagnostic imaging of breast: Secondary | ICD-10-CM | POA: Diagnosis not present

## 2019-06-05 DIAGNOSIS — E039 Hypothyroidism, unspecified: Secondary | ICD-10-CM | POA: Diagnosis not present

## 2019-06-05 DIAGNOSIS — N924 Excessive bleeding in the premenopausal period: Secondary | ICD-10-CM | POA: Diagnosis not present

## 2019-06-05 DIAGNOSIS — M1730 Unilateral post-traumatic osteoarthritis, unspecified knee: Secondary | ICD-10-CM | POA: Diagnosis not present

## 2019-06-05 DIAGNOSIS — I83813 Varicose veins of bilateral lower extremities with pain: Secondary | ICD-10-CM | POA: Diagnosis not present

## 2019-06-05 DIAGNOSIS — Z0001 Encounter for general adult medical examination with abnormal findings: Secondary | ICD-10-CM | POA: Diagnosis not present

## 2019-06-05 DIAGNOSIS — Z23 Encounter for immunization: Secondary | ICD-10-CM | POA: Diagnosis not present

## 2019-06-23 DIAGNOSIS — Z23 Encounter for immunization: Secondary | ICD-10-CM | POA: Diagnosis not present

## 2019-07-21 DIAGNOSIS — Z23 Encounter for immunization: Secondary | ICD-10-CM | POA: Diagnosis not present

## 2019-08-15 ENCOUNTER — Ambulatory Visit (INDEPENDENT_AMBULATORY_CARE_PROVIDER_SITE_OTHER): Payer: BC Managed Care – PPO

## 2019-08-15 ENCOUNTER — Encounter: Payer: Self-pay | Admitting: Family Medicine

## 2019-08-15 ENCOUNTER — Other Ambulatory Visit: Payer: Self-pay

## 2019-08-15 ENCOUNTER — Ambulatory Visit: Payer: BLUE CROSS/BLUE SHIELD | Admitting: Family Medicine

## 2019-08-15 VITALS — BP 90/80 | HR 67 | Ht 65.0 in | Wt 164.0 lb

## 2019-08-15 DIAGNOSIS — G8929 Other chronic pain: Secondary | ICD-10-CM | POA: Diagnosis not present

## 2019-08-15 DIAGNOSIS — M1812 Unilateral primary osteoarthritis of first carpometacarpal joint, left hand: Secondary | ICD-10-CM | POA: Insufficient documentation

## 2019-08-15 DIAGNOSIS — M25562 Pain in left knee: Secondary | ICD-10-CM

## 2019-08-15 DIAGNOSIS — M1712 Unilateral primary osteoarthritis, left knee: Secondary | ICD-10-CM | POA: Insufficient documentation

## 2019-08-15 NOTE — Patient Instructions (Addendum)
Xray of the knee today Drain the knee See me again in 5-6 weeks

## 2019-08-15 NOTE — Progress Notes (Signed)
[facility] [address] [place] [address] Phone: [phone] Subjective:   I Jean Flynn am serving as a Neurosurgeon for Dr. Antoine Primas.  This visit occurred during the SARS-CoV-2 public health emergency.  Safety protocols were in place, including screening questions prior to the visit, additional usage of staff PPE, and extensive cleaning of exam room while observing appropriate contact time as indicated for disinfecting solutions.   I'm seeing this patient by the request  of:  Dortha Kern, MD  CC: Left knee pain, left thumb pain  GYJ:[id number]   02/07/2018 Repeat injection given today.  Tolerated the procedure well.  Discussed icing regimen and home exercise.  Discussed which activities to do which wants to avoid.  Increase activity as tolerated.  Follow-up again in 6 to 12 weeks if necessary but patient may do some changes.  Update 08/15/2019 Jean Flynn is a 49 y.o. female coming in with complaint of left knee pain and left thumb pain. Patient states she has over compensated. Knee hurts in various places. Chronic and taking antiinflammatories. Knee is swollen. Thumb pain is chronic. Certain movements is painful. Grip strength is weak. Pain radiates to the wrist. TTP as well. Thumb pops and grinds. Puts ice on the thumb.       No past medical history on file. No past surgical history on file. Social History   Socioeconomic History  . Marital status: Single    Spouse name: Not on file  . Number of children: Not on file  . Years of education: Not on file  . Highest education level: Not on file  Occupational History  . Not on file  Tobacco Use  . Smoking status: Never Smoker  . Smokeless tobacco: Never Used  Substance and Sexual Activity  . Alcohol use: Not on file  . Drug use: Not on file  . Sexual activity: Not on file  Other Topics Concern  . Not on file  Social History Narrative  . Not on file   Social Determinants of Health    Financial Resource Strain:   . Difficulty of Paying Living Expenses:   Food Insecurity:   . Worried About Programme researcher, broadcasting/film/video in the Last Year:   . Barista in the Last Year:   Transportation Needs:   . Freight forwarder (Medical):   Marland Kitchen Lack of Transportation (Non-Medical):   Physical Activity:   . Days of Exercise per Week:   . Minutes of Exercise per Session:   Stress:   . Feeling of Stress :   Social Connections:   . Frequency of Communication with Friends and Family:   . Frequency of Social Gatherings with Friends and Family:   . Attends Religious Services:   . Active Member of Clubs or Organizations:   . Attends Banker Meetings:   Marland Kitchen Marital Status:    Not on File No family history on file.  Current Outpatient Medications (Endocrine & Metabolic):  .  levothyroxine (SYNTHROID, LEVOTHROID) 125 MCG tablet, TK 1 T PO  D    Current Outpatient Medications (Analgesics):  .  diclofenac (VOLTAREN) 75 MG EC tablet, Take 1 tablet (75 mg total) by mouth 2 (two) times daily.   Current Outpatient Medications (Other):  Marland Kitchen  Vitamin D, Ergocalciferol, (DRISDOL) 50000 units CAPS capsule, Take 1 capsule (50,000 Units total) by mouth every 7 (seven) days.   Reviewed prior external information including notes and imaging from  primary care  provider As well as notes that were available from care everywhere and other healthcare systems.  Past medical history, social, surgical and family history all reviewed in electronic medical record.  No pertanent information unless stated regarding to the chief complaint.   Review of Systems:  No headache, visual changes, nausea, vomiting, diarrhea, constipation, dizziness, abdominal pain, skin rash, fevers, chills, night sweats, weight loss, swollen lymph nodes, body aches, joint swelling, chest pain, shortness of breath, mood changes. POSITIVE muscle aches  Objective  Blood pressure 90/80, pulse 67, height 5\' 5"  (1.651  m), weight 164 lb (74.4 kg), SpO2 98 %.   General: No apparent distress alert and oriented x3 mood and affect normal, dressed appropriately.  HEENT: Pupils equal, extraocular movements intact  Respiratory: Patient's speak in full sentences and does not appear short of breath  Cardiovascular: No lower extremity edema, non tender, no erythema  Neuro: Cranial nerves II through XII are intact, neurovascularly intact in all extremities with 2+ DTRs and 2+ pulses.  Gait normal with good balance and coordination.  MSK:  Non tender with full range of motion and good stability and symmetric strength and tone of shoulders, elbows, , hip,  and ankles bilaterally.  Knee: Left valgus deformity noted. Large thigh to calf ratio.  Small effusion noted Tender to palpation over medial and PF joint line.  ROM full in flexion and extension and lower leg rotation. instability with valgus force.  painful patellar compression. Patellar glide with moderate crepitus.  Patient does have a Baker's cyst noted Patellar and quadriceps tendons unremarkable. Hamstring and quadriceps strength is normal. Contralateral knee shows replacement trace effusion  Left hand exam shows the patient does have CMC positive grind test.  Patient does have some mild weakness with grip strength compared to contralateral side.  No significant atrophy noted of the thenar eminence.  Procedure: Real-time Ultrasound Guided Injection of left knee Device: GE Logiq Q7 Ultrasound guided injection is preferred based studies that show increased duration, increased effect, greater accuracy, decreased procedural pain, increased response rate, and decreased cost with ultrasound guided versus blind injection.  Verbal informed consent obtained.  Time-out conducted.  Noted no overlying erythema, induration, or other signs of local infection.  Skin prepped in a sterile fashion.  Local anesthesia: Topical Ethyl chloride.  With sterile technique and under  real time ultrasound guidance: With a 22-gauge 2 inch needle patient was injected with 4 cc of 0.5% Marcaine and aspirated 10 cc of gel-like fluid then injected 1 cc of Kenalog 40 mg/dL. This was from a posterior approach Completed without difficulty  Pain immediately resolved suggesting accurate placement of the medication.  Advised to call if fevers/chills, erythema, induration, drainage, or persistent bleeding.  Images permanently stored and available for review in the ultrasound unit.  Impression: Technically successful ultrasound guided injection.  Procedure: Real-time Ultrasound Guided Injection of left CMC joint Device: GE Logiq Q7 Ultrasound guided injection is preferred based studies that show increased duration, increased effect, greater accuracy, decreased procedural pain, increased response rate, and decreased cost with ultrasound guided versus blind injection.  Verbal informed consent obtained.  Time-out conducted.  Noted no overlying erythema, induration, or other signs of local infection.  Skin prepped in a sterile fashion.  Local anesthesia: Topical Ethyl chloride.  With sterile technique and under real time ultrasound guidance: With a 25-gauge half inch needle injected with 0.5 cc of 0.5% Marcaine and 0.5 cc of Kenalog 40 mg/mL Completed without difficulty  Pain immediately resolved suggesting accurate placement  of the medication.  Advised to call if fevers/chills, erythema, induration, drainage, or persistent bleeding.  Images permanently stored and available for review in the ultrasound unit.  Impression: Technically successful ultrasound guided injection.   Impression and Recommendations:     This case required medical decision making of moderate complexity. The above documentation has been reviewed and is accurate and complete Lyndal Pulley, DO       Note: This dictation was prepared with Dragon dictation along with smaller phrase technology. Any transcriptional  errors that result from this process are unintentional.

## 2019-08-15 NOTE — Assessment & Plan Note (Signed)
Patient given injection and tolerated the procedure well, discussed icing regimen and home exercises, increase activity slowly.  Exos brace given today for stability.  Will likely need a replacement at some point in the future.

## 2019-08-15 NOTE — Assessment & Plan Note (Signed)
Patient given injection and tolerated the procedure well, discussed icing regimen at home exercises, topical anti-inflammatories, discussed possibly wearing the OA stability brace secondary to the abnormal thigh to calf ratio and instability.  Could be a candidate for viscosupplementation.  Patient wants [place] to hold on any surgical intervention secondary to patient's age.

## 2019-09-12 ENCOUNTER — Ambulatory Visit (INDEPENDENT_AMBULATORY_CARE_PROVIDER_SITE_OTHER): Payer: BC Managed Care – PPO | Admitting: Family Medicine

## 2019-09-12 ENCOUNTER — Other Ambulatory Visit: Payer: Self-pay

## 2019-09-12 ENCOUNTER — Ambulatory Visit: Payer: Self-pay

## 2019-09-12 VITALS — BP 110/74 | HR 75 | Ht 65.0 in | Wt 173.0 lb

## 2019-09-12 DIAGNOSIS — M1712 Unilateral primary osteoarthritis, left knee: Secondary | ICD-10-CM | POA: Diagnosis not present

## 2019-09-12 NOTE — Progress Notes (Signed)
I, Jean Flynn, LAT, ATC, am serving as scribe for Dr. Lynne Leader.  Jean Flynn is a 49 y.o. female who presents to [place] at [facility] today for f/u L knee pain.  She last saw Dr. Tamala Julian on 08/15/19 for her L knee and L CMC joint.  Has not been compliant with brace but thumb pain has subsided.  She has been approved for Synvisc.  Since her last visit, pt reports that knee pain was better. Hyperextended knee one week ago when an 80# dog hit her knee. Did some yard work over the weekend twisted her knee. Up hil increases her pain. Pain over medial aspect of knee. Is using ice and bracing.  She had steroid injection in her left knee about a month ago which was doing great until the dog ran into her knee.  She has resumed knee brace which has helped.  She denies locking or catching or giving way.   Pertinent review of systems: No fevers or chills  Relevant historical information: History of right total knee replacement   Exam:  BP 110/74   Pulse 75   Ht 5\' 5"  (1.651 m)   Wt 173 lb (78.5 kg)   SpO2 99%   BMI 28.79 kg/m  General: Well Developed, well nourished, and in no acute distress.   MSK: Left knee minimal effusion otherwise normal-appearing Tender palpation medial joint line otherwise nontender. Normal range of motion. Stable ligamentous exam. Mildly positive medial McMurray's test. No masses or tenderness palpation posterior medial knee.. Intact strength    Lab and Radiology Results  Diagnostic Limited MSK Ultrasound of: Left knee Intact quad tendon.   Small to medium effusion suprapatellar space. Intact patellar tendon normal-appearing Medial joint line narrowed degenerative appearing with partial extruded degenerative medial meniscus. Lateral joint line slightly narrowed slightly degenerative.  Lateral meniscus Trace Baker's cyst Trace Pes anserine bursitis Normal bony structures otherwise Impression: Joint effusion DJD probable medial  meniscus tear.  Tiny Baker's cyst.  Trace pes anserine bursitis   Procedure: Real-time Ultrasound Guided Injection of left knee superior patellar space Device: Philips Affiniti 50G Images permanently stored and available for review in the ultrasound unit. Verbal informed consent obtained.  Discussed risks and benefits of procedure. Warned about infection bleeding damage to structures skin hypopigmentation and fat atrophy among others. Patient expresses understanding and agreement Time-out conducted.   Noted no overlying erythema, induration, or other signs of local infection.   Skin prepped in a sterile fashion.   Local anesthesia: Topical Ethyl chloride.   With sterile technique and under real time ultrasound guidance:  4 mg of Kenalog and 3 mL of Marcaine injected easily.   Completed without difficulty   Pain immediately resolved suggesting accurate placement of the medication.   Advised to call if fevers/chills, erythema, induration, drainage, or persistent bleeding.   Images permanently stored and available for review in the ultrasound unit.  Impression: Technically successful ultrasound guided injection.       Assessment and Plan: 49 y.o. female with left knee pain.  Fundamentally patient has DJD primarily causing her left knee pain and effusion.  However she suffered a recent injury where her dog ran into her knee and then she twisted her knee again.  She likely reirritated or repartially tore the medial meniscus causing her exacerbation of pain.  This is already settling down but is still somewhat obnoxious.  We discussed today her options.  She is already approved for Synvisc.  However her current  knee pain exacerbation is more due to an acute irritation or exacerbation than chronic typical knee pain from arthritis.  Although it has only been a month since her last steroid injection I think in the special circumstances reasonable to repeat steroid injection.  Hopefully this will  settle it down and get her back to her baseline.  If knee pain starts returning again then would she would be a great candidate for Synvisc injection.  After having this discussion she expresses agreement.  Recheck back with myself or Dr. Katrinka Blazing as needed.    Orders Placed This Encounter  Procedures  . [place] LIMITED JOINT SPACE STRUCTURES LOW LEFT(NO LINKED CHARGES)    Order Specific Question:   Reason for Exam (SYMPTOM  OR DIAGNOSIS REQUIRED)    Answer:   left knee pain    Order Specific Question:   Preferred imaging location?    Answer:   [facility]   No orders of the defined types were placed in this encounter.    Discussed warning signs or symptoms. Please see discharge instructions. Patient expresses understanding.   The above documentation has been reviewed and is accurate and complete [name]

## 2019-09-19 ENCOUNTER — Ambulatory Visit: Payer: BC Managed Care – PPO | Admitting: Family Medicine

## 2019-09-26 ENCOUNTER — Ambulatory Visit (INDEPENDENT_AMBULATORY_CARE_PROVIDER_SITE_OTHER): Payer: BC Managed Care – PPO | Admitting: Family Medicine

## 2019-09-26 ENCOUNTER — Ambulatory Visit: Payer: BC Managed Care – PPO | Admitting: Family Medicine

## 2019-09-26 ENCOUNTER — Encounter: Payer: Self-pay | Admitting: Family Medicine

## 2019-09-26 ENCOUNTER — Ambulatory Visit: Payer: Self-pay

## 2019-09-26 ENCOUNTER — Other Ambulatory Visit: Payer: Self-pay

## 2019-09-26 VITALS — BP 106/70 | HR 61 | Ht 65.0 in | Wt 169.4 lb

## 2019-09-26 DIAGNOSIS — G8929 Other chronic pain: Secondary | ICD-10-CM | POA: Diagnosis not present

## 2019-09-26 DIAGNOSIS — M25562 Pain in left knee: Secondary | ICD-10-CM | POA: Diagnosis not present

## 2019-09-26 DIAGNOSIS — M7121 Synovial cyst of popliteal space [Baker], right knee: Secondary | ICD-10-CM | POA: Diagnosis not present

## 2019-09-26 NOTE — Patient Instructions (Addendum)
Thank you for coming in today. Call or go to the ER if you develop a large red swollen joint with extreme pain or oozing puss.  Recheck in about 7-12 days for left knee Synvisc injection 2/3

## 2019-09-26 NOTE — Progress Notes (Addendum)
I, Wendy Poet, LAT, ATC, am serving as scribe for Dr. Lynne Leader.  Jean Flynn is a 49 y.o. female who presents to [place] at [facility] today for f/u of chronic L knee pain and swelling.  She was last seen by Dr. Georgina Snell on 09/12/19 for re-injury/-irritation of prior L knee pain after she was hit in the knee by a large dog.  She had a L knee corticosteroid injection at her last visit.  Since then, pt reports that her L knee felt better until Sunday when she went for a 6.5 mile small walk.  She states that since then she has had difficulty walking w/ pain and swelling at the back of her knee.  She has almost finished her diclofenac 75mg .  Diagnostic testing: L knee XR- 08/15/19   Pertinent review of systems: No fevers or chills  Relevant historical information: History right total knee replacement by Dr. Berenice Primas   Exam:  BP 106/70 (BP Location: Left Arm, Patient Position: Sitting, Cuff Size: Normal)   Pulse 61   Ht 5\' 5"  (1.651 m)   Wt 169 lb 6.4 oz (76.8 kg)   SpO2 99%   BMI 28.19 kg/m  General: Well Developed, well nourished, and in no acute distress.   MSK: Left knee mild effusion otherwise normal-appearing Mildly tender palpation anterior medial joint line. Range of motion 0-120 degrees.  Some pain with full extension felt at the posterior aspect of the knee. Stable ligamentous exam. Positive medial McMurray's test. Intact strength.    Lab and Radiology Results  Diagnostic Limited MSK Ultrasound of: Left knee Small Baker's cyst present measuring at 90 degrees flexion less than 1 cm at largest portion.  However the cyst is somewhat C-shaped and there is a longer portion visible on imaging.  Total volume is quite low. Medial joint line is narrowed with degenerative appearing meniscus with some tearing present. Impression: Small Baker's cyst  Procedure: Real-time Ultrasound Guided Injection of the knee Baker's cyst Device: Philips Affiniti 50G Images  permanently stored and available for review in the ultrasound unit. Verbal informed consent obtained.  Discussed risks and benefits of procedure. Warned about infection bleeding damage to structures skin hypopigmentation and fat atrophy among others. Patient expresses understanding and agreement Time-out conducted.   Noted no overlying erythema, induration, or other signs of local infection.   Skin prepped in a sterile fashion.   Local anesthesia: Topical Ethyl chloride.   With sterile technique and under real time ultrasound guidance:  40 mg of Kenalog and 1 mL Marcaine injected easily.   Completed without difficulty   Pain immediately resolved suggesting accurate placement of the medication.   Advised to call if fevers/chills, erythema, induration, drainage, or persistent bleeding.   Images permanently stored and available for review in the ultrasound unit.  Impression: Technically successful ultrasound guided injection.    Procedure: Real-time Ultrasound Guided Injection of left knee lateral superior patellar space Synvisc 1/3 Device: Philips Affiniti 50G Images permanently stored and available for review in the ultrasound unit. Verbal informed consent obtained.  Discussed risks and benefits of procedure. Warned about infection bleeding damage to structures skin hypopigmentation and fat atrophy among others. Patient expresses understanding and agreement Time-out conducted.   Noted no overlying erythema, induration, or other signs of local infection.   Skin prepped in a sterile fashion.   Local anesthesia: Topical Ethyl chloride.   With sterile technique and under real time ultrasound guidance:  Synvisc injected easily.   Completed without  difficulty    Advised to call if fevers/chills, erythema, induration, drainage, or persistent bleeding.   Images permanently stored and available for review in the ultrasound unit.  Impression: Technically successful ultrasound guided  injection.  LOT number [id number]       Assessment and Plan: 49 y.o. female with left knee pain and swelling.  Patient certainly does have DJD present in the left knee.  She also has a likely exacerbation of medial meniscus tear causing some of the knee effusion and Baker's cyst.. She had intra-articular steroid injection about 2 weeks ago which helped but the symptoms returned. Plan today to inject a Baker's cyst.  Total volume is small enough that aspiration probably does not make sense. Additionally will start Synvisc series for DJD.  First injection given today. Recheck in about 1 week for second Synvisc injection.  Additionally discussed next steps if not better.  At this point if she is not improving after steroid injections and Synvisc series would likely proceed to MRI for surgical planning.  She obviously will be a total knee replacement surgical candidate but she may have some benefit in arthroscopic debridement surgery depending on changes seen on MRI.    Orders Placed This Encounter  Procedures  . [place] LIMITED JOINT SPACE STRUCTURES LOW LEFT(NO LINKED CHARGES)    Order Specific Question:   Reason for Exam (SYMPTOM  OR DIAGNOSIS REQUIRED)    Answer:   L knee pain    Order Specific Question:   Preferred imaging location?    Answer:   [facility]   No orders of the defined types were placed in this encounter.    Discussed warning signs or symptoms. Please see discharge instructions. Patient expresses understanding.   The above documentation has been reviewed and is accurate and complete [name], M.D.

## 2019-10-09 ENCOUNTER — Ambulatory Visit (INDEPENDENT_AMBULATORY_CARE_PROVIDER_SITE_OTHER): Payer: BC Managed Care – PPO | Admitting: Family Medicine

## 2019-10-09 ENCOUNTER — Ambulatory Visit: Payer: Self-pay

## 2019-10-09 DIAGNOSIS — M1712 Unilateral primary osteoarthritis, left knee: Secondary | ICD-10-CM | POA: Diagnosis not present

## 2019-10-09 DIAGNOSIS — G8929 Other chronic pain: Secondary | ICD-10-CM

## 2019-10-09 DIAGNOSIS — M25562 Pain in left knee: Secondary | ICD-10-CM

## 2019-10-09 NOTE — Progress Notes (Signed)
Patient presents to clinic today for previously scheduled left knee Synvisc injection 2/3  Procedure: Real-time Ultrasound Guided Injection of left knee lateral superior patellar space Device: Philips Affiniti 50G Images permanently stored and available for review in the ultrasound unit. Verbal informed consent obtained.  Discussed risks and benefits of procedure. Warned about infection bleeding damage to structures skin hypopigmentation and fat atrophy among others. Patient expresses understanding and agreement Time-out conducted.   Noted no overlying erythema, induration, or other signs of local infection.   Skin prepped in a sterile fashion.   Local anesthesia: Topical Ethyl chloride.   With sterile technique and under real time ultrasound guidance:  Synvisc injected easily.   Completed without difficulty    Advised to call if fevers/chills, erythema, induration, drainage, or persistent bleeding.   Images permanently stored and available for review in the ultrasound unit.  Impression: Technically successful ultrasound guided injection.  Lot number: [id number]  Recheck 1 week for left knee Synvisc injection 3/3

## 2019-10-09 NOTE — Patient Instructions (Signed)
You had a L knee injection today.  Call or go to the ER if you develop a large red swollen joint with extreme pain or oozing puss.    

## 2019-10-23 ENCOUNTER — Other Ambulatory Visit: Payer: Self-pay

## 2019-10-23 ENCOUNTER — Ambulatory Visit (INDEPENDENT_AMBULATORY_CARE_PROVIDER_SITE_OTHER): Payer: BC Managed Care – PPO | Admitting: Family Medicine

## 2019-10-23 ENCOUNTER — Encounter: Payer: Self-pay | Admitting: Family Medicine

## 2019-10-23 DIAGNOSIS — M1712 Unilateral primary osteoarthritis, left knee: Secondary | ICD-10-CM | POA: Diagnosis not present

## 2019-10-23 NOTE — Progress Notes (Signed)
[name] [address] [place] [address] Phone: [phone] Subjective:    I'm seeing this patieI, Wilford Grist, am serving as a scribe for Dr. Antoine Primas. This visit occurred during the SARS-CoV-2 public health emergency.  Safety protocols were in place, including screening questions prior to the visit, additional usage of staff PPE, and extensive cleaning of exam room while observing appropriate contact time as indicated for disinfecting solutions.  nt by the request  of:  Dortha Kern, MD  CC: Left knee pain follow-up  ION:[id number]  Jean Flynn is a 49 y.o. female coming in with complaint of left knee pain. Patient is here for the 3rd synvisc injection. Patient states that she has not felt any improvement. Feels like Baker's cyst may be back. On Sunday, she had increase in swelling in left knee. Has been on her feet a lot more.  Patient is started viscosupplementation.  Here for third and final viscosupplementation injection.     No past medical history on file. No past surgical history on file. Social History   Socioeconomic History  . Marital status: Single    Spouse name: Not on file  . Number of children: Not on file  . Years of education: Not on file  . Highest education level: Not on file  Occupational History  . Not on file  Tobacco Use  . Smoking status: Never Smoker  . Smokeless tobacco: Never Used  Substance and Sexual Activity  . Alcohol use: Not on file  . Drug use: Not on file  . Sexual activity: Not on file  Other Topics Concern  . Not on file  Social History Narrative  . Not on file   Social Determinants of Health   Financial Resource Strain:   . Difficulty of Paying Living Expenses:   Food Insecurity:   . Worried About Programme researcher, broadcasting/film/video in the Last Year:   . Barista in the Last Year:   Transportation Needs:   . Freight forwarder (Medical):   Marland Kitchen Lack of Transportation (Non-Medical):     Physical Activity:   . Days of Exercise per Week:   . Minutes of Exercise per Session:   Stress:   . Feeling of Stress :   Social Connections:   . Frequency of Communication with Friends and Family:   . Frequency of Social Gatherings with Friends and Family:   . Attends Religious Services:   . Active Member of Clubs or Organizations:   . Attends Banker Meetings:   Marland Kitchen Marital Status:    No Known Allergies No family history on file.  Current Outpatient Medications (Endocrine & Metabolic):  .  levothyroxine (SYNTHROID, LEVOTHROID) 125 MCG tablet, TK 1 T PO  D    Current Outpatient Medications (Analgesics):  .  diclofenac (VOLTAREN) 75 MG EC tablet, Take 1 tablet (75 mg total) by mouth 2 (two) times daily.   Current Outpatient Medications (Other):  Marland Kitchen  Vitamin D, Ergocalciferol, (DRISDOL) 50000 units CAPS capsule, Take 1 capsule (50,000 Units total) by mouth every 7 (seven) days.   Reviewed prior external information including notes and imaging from  primary care provider As well as notes that were available from care everywhere and other healthcare systems.  Past medical history, social, surgical and family history all reviewed in electronic medical record.  No pertanent information unless stated regarding to the chief complaint.   Review of Systems:  No headache, visual changes, nausea,  vomiting, diarrhea, constipation, dizziness, abdominal pain, skin rash, fevers, chills, night sweats, weight loss, swollen lymph nodes, body aches, joint swelling, chest pain, shortness of breath, mood changes. POSITIVE muscle aches  Objective  Blood pressure 102/68, pulse 68, height 5\' 5"  (1.651 m), weight 169 lb (76.7 kg).   General: No apparent distress alert and oriented x3 mood and affect normal, dressed appropriately.  HEENT: Pupils equal, extraocular movements intact  Respiratory: Patient's speak in full sentences and does not appear short of breath  Cardiovascular: No  lower extremity edema, non tender, no erythema  Neuro: Cranial nerves II through XII are intact, neurovascularly intact in all extremities with 2+ DTRs and 2+ pulses.  Gait antalgic gait Knee: Left valgus deformity noted. Large thigh to calf ratio.  Tender to palpation over medial and PF joint line.  ROM full in flexion and extension and lower leg rotation. instability with valgus force.  painful patellar compression. Patellar glide with moderate crepitus. Patellar and quadriceps tendons unremarkable. Hamstring and quadriceps strength is normal. Contralateral knee shows replacement  After informed written and verbal consent, patient was seated on exam table. Left knee was prepped with alcohol swab and utilizing anterolateral approach, patient's left knee space was injected with 16 mg/2.5 mL of Synvisc (sodium hyaluronate) in a prefilled syringe was injected easily into the knee through a 22-gauge needle.. Patient tolerated the procedure well without immediate complications.    Impression and Recommendations:     The above documentation has been reviewed and is accurate and complete Lyndal Pulley, DO       Note: This dictation was prepared with Dragon dictation along with smaller phrase technology. Any transcriptional errors that result from this process are unintentional.

## 2019-10-23 NOTE — Assessment & Plan Note (Signed)
Third and final viscosupplementation given today.  Discussed with patient about continuing the brace.  Vitamin D supplementation, icing regimen.  Follow-up again in 4 to 8 weeks

## 2019-10-23 NOTE — Patient Instructions (Addendum)
Last injection today Colchicine 2x a day for 3 days If it works will send in Allopurinol See me in 4-6 weeks

## 2019-10-24 ENCOUNTER — Ambulatory Visit: Payer: BC Managed Care – PPO | Admitting: Family Medicine

## 2019-12-12 ENCOUNTER — Other Ambulatory Visit: Payer: Self-pay

## 2019-12-12 ENCOUNTER — Ambulatory Visit (INDEPENDENT_AMBULATORY_CARE_PROVIDER_SITE_OTHER): Payer: BLUE CROSS/BLUE SHIELD | Admitting: Family Medicine

## 2019-12-12 ENCOUNTER — Ambulatory Visit (INDEPENDENT_AMBULATORY_CARE_PROVIDER_SITE_OTHER): Payer: BLUE CROSS/BLUE SHIELD

## 2019-12-12 ENCOUNTER — Encounter: Payer: Self-pay | Admitting: Family Medicine

## 2019-12-12 VITALS — BP 110/80 | HR 72 | Ht 65.0 in | Wt 169.0 lb

## 2019-12-12 DIAGNOSIS — M7121 Synovial cyst of popliteal space [Baker], right knee: Secondary | ICD-10-CM | POA: Diagnosis not present

## 2019-12-12 NOTE — Progress Notes (Signed)
Jean Flynn, am serving as a Neurosurgeon for Dr. [name].  Jean Flynn is a 49 y.o. female who presents to [facility] today for f/u of L knee pain.  She was last seen by Dr. Katrinka Blazing on 10/23/19 and had her final Synvisc injection for her L knee.  She was previously seen by Dr. Denyse Amass on 09/26/19 and had a L knee aspiration/injection (Synvisd 1/3).  Since her last visit w/ Dr. Katrinka Blazing, she reports She has been moving for the past 18 days going up and down steep steps. States her L knee is not as swollen as it was when she made the visit, but is still hurting and having to limp when she walks. Patient has been using NSAID's and icing which has helped..  She understands that she will probably need a total knee replacement relatively soon.  She would like to delay until at least January 2022.  Diagnostic testing: L knee XR- 08/15/19   Pertinent review of systems: No fevers or chills  Relevant historical information: Right total knee replacement   Exam:  BP 110/80 (BP Location: Left Arm, Patient Position: Sitting, Cuff Size: Normal)   Pulse 72   Ht 5\' 5"  (1.651 m)   Wt 169 lb (76.7 kg)   SpO2 98%   BMI 28.12 kg/m  General: Well Developed, well nourished, and in no acute distress.   MSK: Left knee mild effusion slight swelling posterior knee. Normal knee motion. Intact strength. Particular tender to palpation.    Lab and Radiology Results  Procedure: Real-time Ultrasound Guided aspiration and injection of left Baker's cyst Device: Philips Affiniti 50G Images permanently stored and available for review in the ultrasound unit. Verbal informed consent obtained.  Discussed risks and benefits of procedure. Warned about infection bleeding damage to structures skin hypopigmentation and fat atrophy among others. Patient expresses understanding and agreement Time-out conducted.   Noted no overlying erythema, induration, or other signs of local infection.     Skin prepped in a sterile fashion.   Local anesthesia: Topical Ethyl chloride.   With sterile technique and under real time ultrasound guidance:  1 mL of lidocaine injected achieving good anesthesia.   Skin was again sterilized. 18-gauge needle used to access the Baker's cyst. 10 mL of clear yellow fluid aspirated. Syringe exchanged and 40 mg of Kenalog and 2 mL of Marcaine were injected Completed without difficulty   Pain immediately resolved suggesting accurate placement of the medication.   Advised to call if fevers/chills, erythema, induration, drainage, or persistent bleeding.   Images permanently stored and available for review in the ultrasound unit.  Impression: Technically successful ultrasound guided injection.     Assessment and Plan: 49 y.o. female with Baker's cyst and knee effusion due to DJD.  Recently completed hyaluronic acid series.  She notes her symptoms started after she effectively over pushed it. Plan for aspiration and injection of Baker's cyst today.  Ultimately she will need total knee replacement.  I think it is reasonable that we will be able to get her sufficiently controlled until her planned date of January.  Recommend follow-up with orthopedic surgery sometime in October to get it scheduled.    Orders Placed This Encounter  Procedures  . November LIMITED JOINT SPACE STRUCTURES LOW LEFT    Standing Status:   Future    Number of Occurrences:   1    Standing Expiration Date:   12/11/2020    Order Specific Question:  Reason for Exam (SYMPTOM  OR DIAGNOSIS REQUIRED)    Answer:   L knee pain    Order Specific Question:   Preferred imaging location?    Answer:   [facility]   No orders of the defined types were placed in this encounter.    Discussed warning signs or symptoms. Please see discharge instructions. Patient expresses understanding.   The above documentation has been reviewed and is accurate and complete [name],  M.D.

## 2019-12-12 NOTE — Patient Instructions (Signed)
Thank you for coming in today. Call or go to the ER if you develop a large red swollen joint with extreme pain or oozing puss.  Recheck as needed.  I agree with TKR plan for January.  You should see the surgeon in October.

## 2020-02-25 DIAGNOSIS — M1712 Unilateral primary osteoarthritis, left knee: Secondary | ICD-10-CM | POA: Diagnosis not present

## 2020-02-25 DIAGNOSIS — M25562 Pain in left knee: Secondary | ICD-10-CM | POA: Diagnosis not present

## 2020-02-25 DIAGNOSIS — M25561 Pain in right knee: Secondary | ICD-10-CM | POA: Diagnosis not present

## 2020-02-25 DIAGNOSIS — Z96651 Presence of right artificial knee joint: Secondary | ICD-10-CM | POA: Diagnosis not present

## 2020-02-28 DIAGNOSIS — Z1231 Encounter for screening mammogram for malignant neoplasm of breast: Secondary | ICD-10-CM | POA: Diagnosis not present

## 2020-02-28 DIAGNOSIS — Z1239 Encounter for other screening for malignant neoplasm of breast: Secondary | ICD-10-CM | POA: Diagnosis not present

## 2020-04-08 DIAGNOSIS — M25562 Pain in left knee: Secondary | ICD-10-CM | POA: Diagnosis not present

## 2020-04-14 DIAGNOSIS — Z23 Encounter for immunization: Secondary | ICD-10-CM | POA: Diagnosis not present

## 2020-05-12 DIAGNOSIS — N951 Menopausal and female climacteric states: Secondary | ICD-10-CM | POA: Diagnosis not present

## 2020-05-12 DIAGNOSIS — Z01818 Encounter for other preprocedural examination: Secondary | ICD-10-CM | POA: Diagnosis not present

## 2020-05-12 DIAGNOSIS — M1730 Unilateral post-traumatic osteoarthritis, unspecified knee: Secondary | ICD-10-CM | POA: Diagnosis not present

## 2020-05-12 DIAGNOSIS — Z0001 Encounter for general adult medical examination with abnormal findings: Secondary | ICD-10-CM | POA: Diagnosis not present

## 2020-05-12 DIAGNOSIS — E039 Hypothyroidism, unspecified: Secondary | ICD-10-CM | POA: Diagnosis not present

## 2020-06-11 DIAGNOSIS — Z01818 Encounter for other preprocedural examination: Secondary | ICD-10-CM | POA: Diagnosis not present

## 2020-06-17 DIAGNOSIS — M25562 Pain in left knee: Secondary | ICD-10-CM | POA: Diagnosis not present

## 2020-06-17 DIAGNOSIS — M1712 Unilateral primary osteoarthritis, left knee: Secondary | ICD-10-CM | POA: Diagnosis not present

## 2020-06-25 DIAGNOSIS — M1712 Unilateral primary osteoarthritis, left knee: Secondary | ICD-10-CM | POA: Diagnosis not present

## 2020-09-26 IMAGING — DX DG KNEE COMPLETE 4+V*L*
4 series · 4 of 4 positions shown · non-contrast
Comparison: None.

CLINICAL DATA: Left knee pain.

EXAM:
LEFT KNEE - COMPLETE 4+ VIEW

[knee ap]
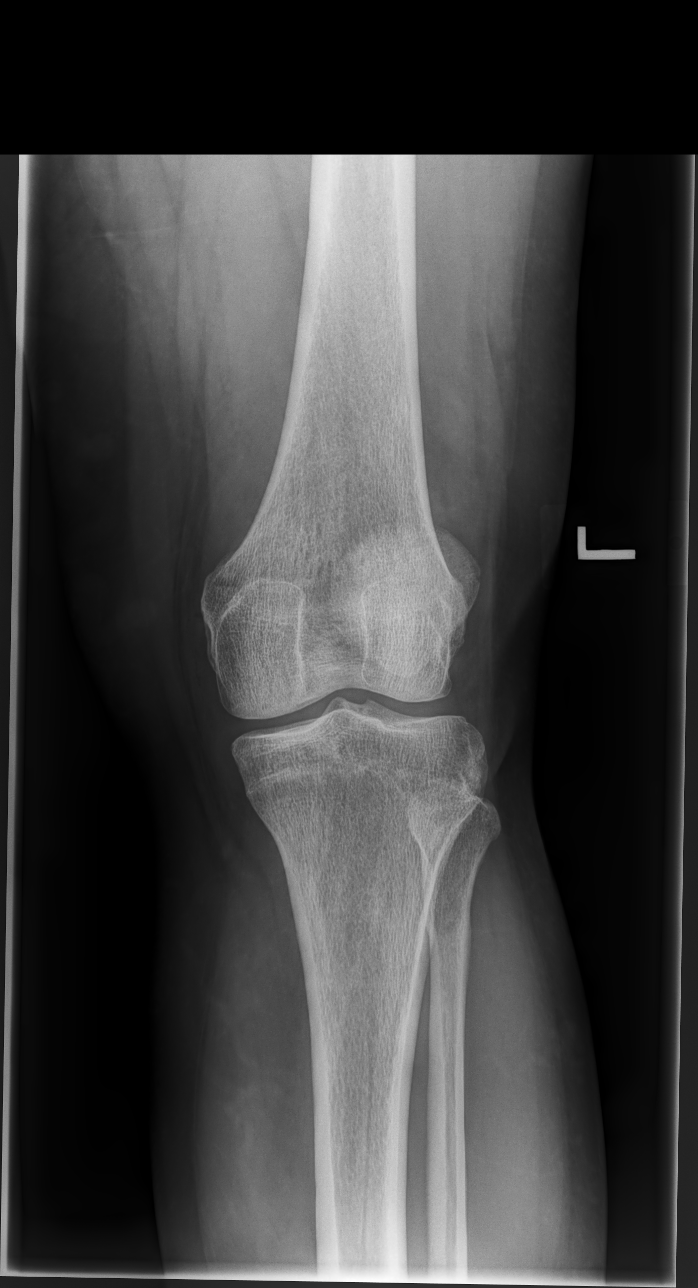

[knee lat]
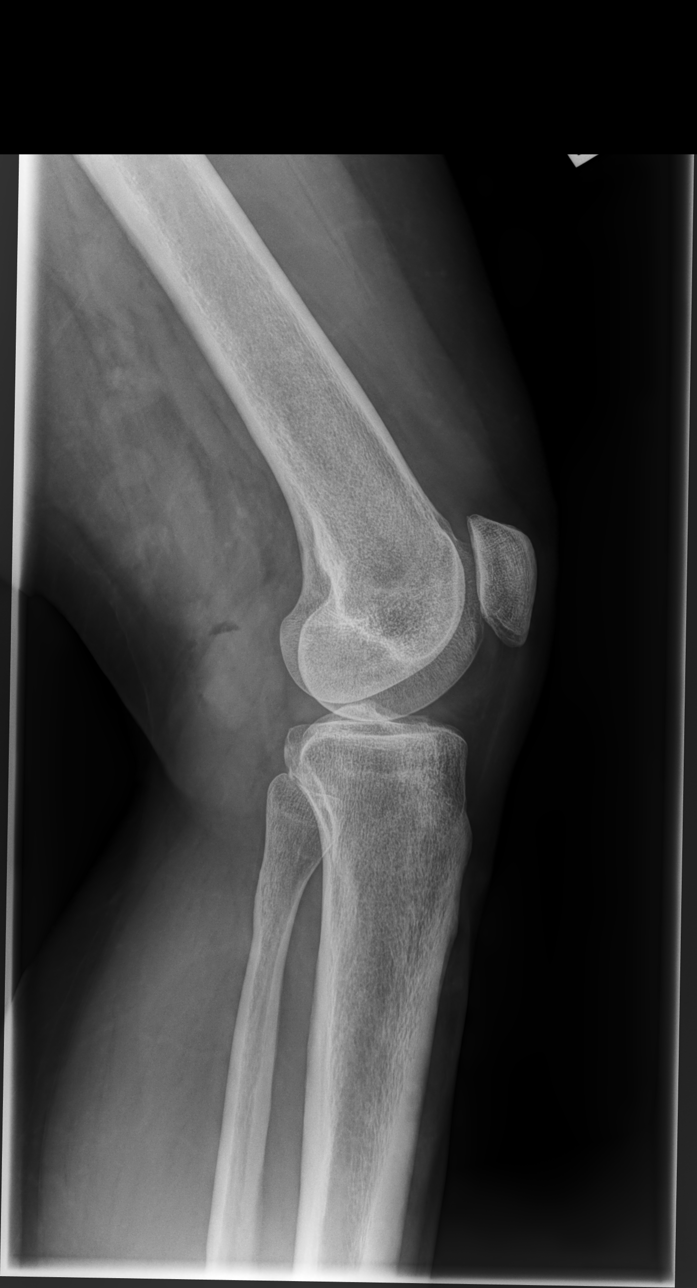

[knee (tunnel view) pa]
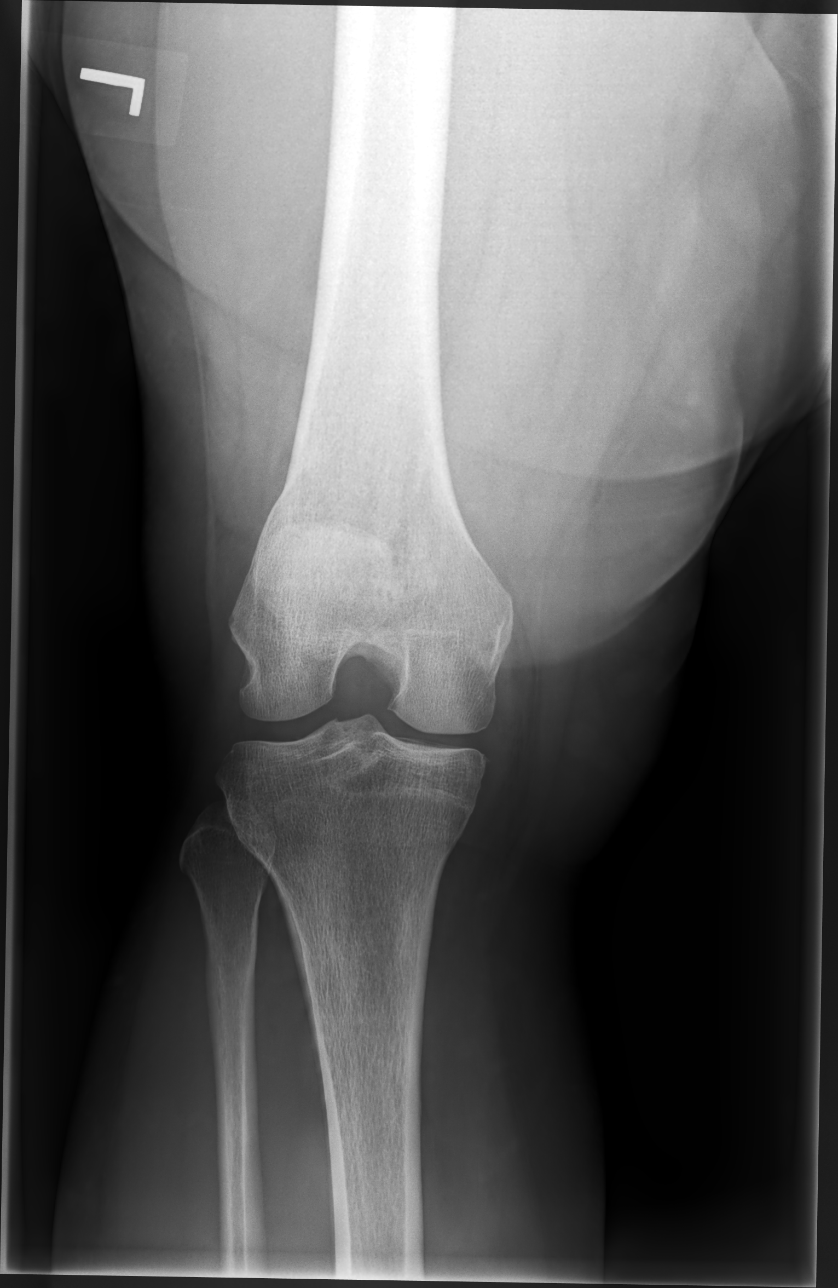

[patella (sunrise) tan]
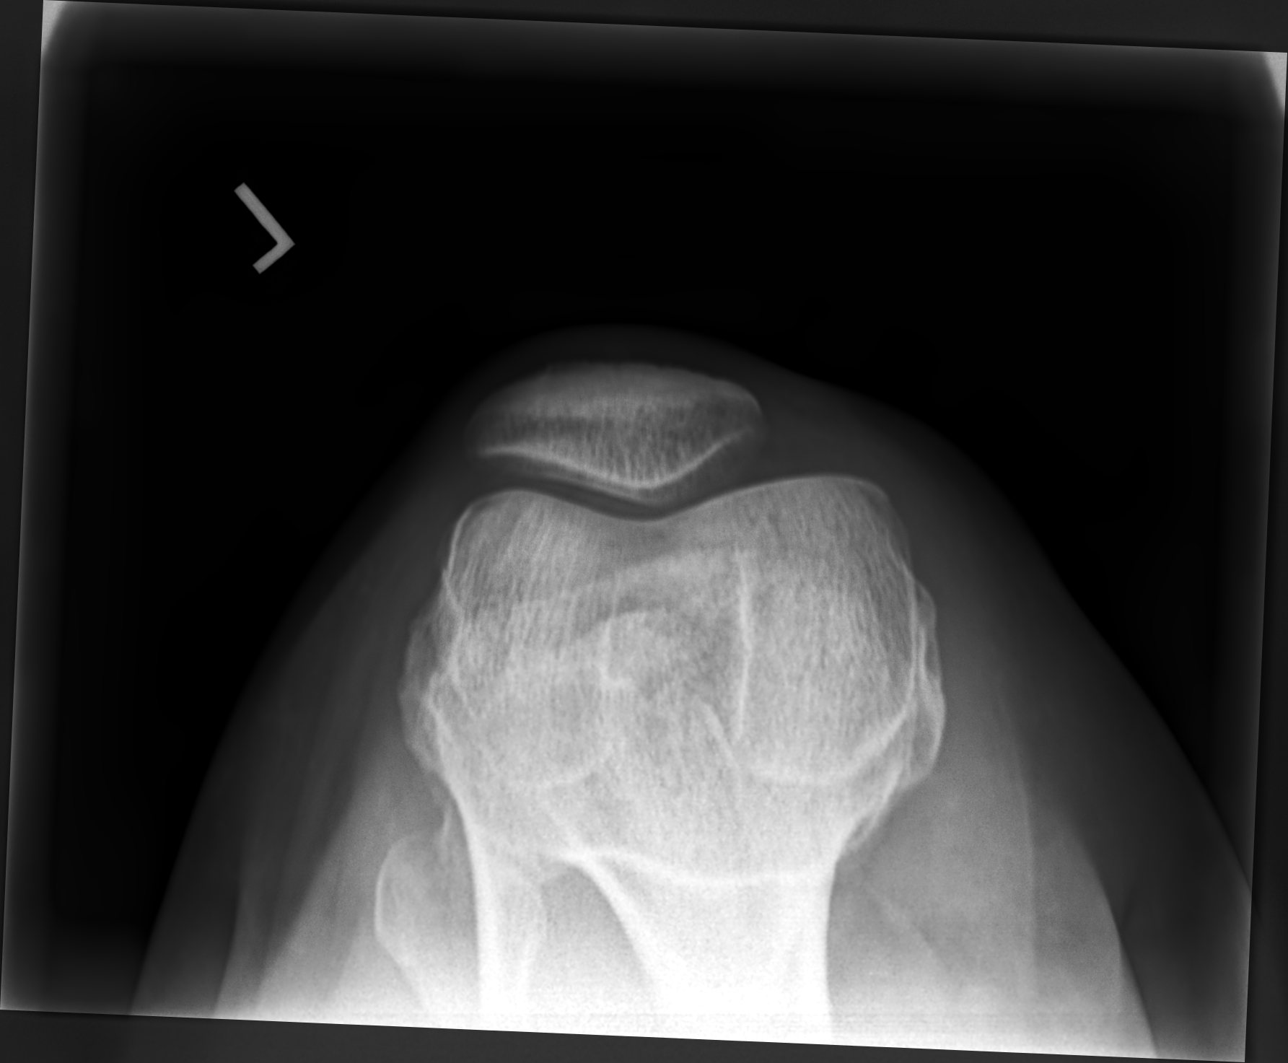

[4 of 4 positions shown; findings below may reference images not displayed]

FINDINGS: No evidence of fracture, dislocation, or joint effusion. No evidence
of arthropathy or other focal bone abnormality. Soft tissues are
unremarkable. Varicose veins are noted.
IMPRESSION: 1. No acute osseous abnormality in the left knee.
2. Varicose veins.

## 2022-07-27 NOTE — Progress Notes (Deleted)
[name] [address] [place] [address] Phone: [phone] Subjective:    I'm seeing this patient by the request  of:  Dortha Kern, MD  CC:   [id number]  Last seen June 2021 for knee pain  Jean Flynn is a 52 y.o. female coming in with complaint of L thumb pain. Possibly need a new brace we discussed with this patient back in 2021 and last injection was in April 2020  Onset-  Location Duration-  Character- Aggravating factors- Reliving factors-  Therapies tried-  Severity-     No past medical history on file. No past surgical history on file. Social History   Socioeconomic History   Marital status: Single    Spouse name: Not on file   Number of children: Not on file   Years of education: Not on file   Highest education level: Not on file  Occupational History   Not on file  Tobacco Use   Smoking status: Never   Smokeless tobacco: Never  Substance and Sexual Activity   Alcohol use: Not on file   Drug use: Not on file   Sexual activity: Not on file  Other Topics Concern   Not on file  Social History Narrative   Not on file   Social Determinants of Health   Financial Resource Strain: Not on file  Food Insecurity: Not on file  Transportation Needs: Not on file  Physical Activity: Not on file  Stress: Not on file  Social Connections: Not on file   No Known Allergies No family history on file.  Current Outpatient Medications (Endocrine & Metabolic):    levothyroxine (SYNTHROID, LEVOTHROID) 125 MCG tablet, TK 1 T PO  D    Current Outpatient Medications (Analgesics):    diclofenac (VOLTAREN) 75 MG EC tablet, Take 1 tablet (75 mg total) by mouth 2 (two) times daily.   Current Outpatient Medications (Other):    Vitamin D, Ergocalciferol, (DRISDOL) 50000 units CAPS capsule, Take 1 capsule (50,000 Units total) by mouth every 7 (seven) days.   Reviewed prior external information including notes and imaging  from  primary care provider As well as notes that were available from care everywhere and other healthcare systems.  Patient has been seen by cardiology for varicose veins.  Past medical history, social, surgical and family history all reviewed in electronic medical record.  No pertanent information unless stated regarding to the chief complaint.   Review of Systems:  No headache, visual changes, nausea, vomiting, diarrhea, constipation, dizziness, abdominal pain, skin rash, fevers, chills, night sweats, weight loss, swollen lymph nodes, body aches, joint swelling, chest pain, shortness of breath, mood changes. POSITIVE muscle aches  Objective  There were no vitals taken for this visit.   General: No apparent distress alert and oriented x3 mood and affect normal, dressed appropriately.  HEENT: Pupils equal, extraocular movements intact  Respiratory: Patient's speak in full sentences and does not appear short of breath  Cardiovascular: No lower extremity edema, non tender, no erythema  Thumb exam shows   Procedure: Real-time Ultrasound Guided Injection of  Device: GE Logiq Q7 Ultrasound guided injection is preferred based studies that show increased duration, increased effect, greater accuracy, decreased procedural pain, increased response rate, and decreased cost with ultrasound guided versus blind injection.  Verbal informed consent obtained.  Time-out conducted.  Noted no overlying erythema, induration, or other signs of local infection.  Skin prepped in a sterile fashion.  Local anesthesia: Topical Ethyl chloride.  With sterile technique and under real time ultrasound guidance:   Completed without difficulty  Pain immediately resolved suggesting accurate placement of the medication.  Advised to call if fevers/chills, erythema, induration, drainage, or persistent bleeding.  Impression: Technically successful ultrasound guided injection.    Impression and Recommendations:      The above documentation has been reviewed and is accurate and complete Judi Saa, DO

## 2022-07-28 ENCOUNTER — Ambulatory Visit: Payer: BLUE CROSS/BLUE SHIELD | Admitting: Family Medicine
